# Patient Record
Sex: Female | Born: 1935
Health system: Southern US, Community
[De-identification: ages and names within clinical notes are randomized; demographics above are authoritative.]

## PROBLEM LIST (undated history)

## (undated) DIAGNOSIS — H269 Unspecified cataract: Secondary | ICD-10-CM

## (undated) DIAGNOSIS — Z8781 Personal history of (healed) traumatic fracture: Secondary | ICD-10-CM

## (undated) DIAGNOSIS — R05 Cough: Secondary | ICD-10-CM

## (undated) DIAGNOSIS — M1990 Unspecified osteoarthritis, unspecified site: Secondary | ICD-10-CM

## (undated) DIAGNOSIS — K635 Polyp of colon: Secondary | ICD-10-CM

## (undated) DIAGNOSIS — Z9109 Other allergy status, other than to drugs and biological substances: Secondary | ICD-10-CM

## (undated) DIAGNOSIS — N189 Chronic kidney disease, unspecified: Secondary | ICD-10-CM

## (undated) DIAGNOSIS — M810 Age-related osteoporosis without current pathological fracture: Secondary | ICD-10-CM

## (undated) DIAGNOSIS — K5790 Diverticulosis of intestine, part unspecified, without perforation or abscess without bleeding: Secondary | ICD-10-CM

## (undated) HISTORY — DX: Cough: R05

## (undated) HISTORY — DX: Polyp of colon: K63.5

## (undated) HISTORY — DX: Diverticulosis of intestine, part unspecified, without perforation or abscess without bleeding: K57.90

## (undated) HISTORY — DX: Unspecified osteoarthritis, unspecified site: M19.90

## (undated) HISTORY — DX: Other allergy status, other than to drugs and biological substances: Z91.09

## (undated) HISTORY — DX: Personal history of (healed) traumatic fracture: Z87.81

## (undated) HISTORY — PX: NO PAST SURGERIES: SHX2092

## (undated) HISTORY — DX: Unspecified cataract: H26.9

## (undated) HISTORY — DX: Age-related osteoporosis without current pathological fracture: M81.0

---

## 2000-05-22 ENCOUNTER — Ambulatory Visit (HOSPITAL_BASED_OUTPATIENT_CLINIC_OR_DEPARTMENT_OTHER): Admission: RE | Admit: 2000-05-22 | Discharge: 2000-05-22 | Payer: Self-pay | Admitting: General Surgery

## 2004-01-03 ENCOUNTER — Encounter: Payer: Self-pay | Admitting: Internal Medicine

## 2004-09-25 ENCOUNTER — Ambulatory Visit: Payer: Self-pay | Admitting: Internal Medicine

## 2005-06-15 ENCOUNTER — Ambulatory Visit: Payer: Self-pay | Admitting: Internal Medicine

## 2005-07-19 ENCOUNTER — Ambulatory Visit: Payer: Self-pay | Admitting: Internal Medicine

## 2005-09-25 ENCOUNTER — Encounter: Admission: RE | Admit: 2005-09-25 | Discharge: 2005-09-25 | Payer: Self-pay | Admitting: Orthopedic Surgery

## 2007-01-22 DIAGNOSIS — M810 Age-related osteoporosis without current pathological fracture: Secondary | ICD-10-CM | POA: Insufficient documentation

## 2007-01-22 DIAGNOSIS — M1990 Unspecified osteoarthritis, unspecified site: Secondary | ICD-10-CM | POA: Insufficient documentation

## 2007-07-03 ENCOUNTER — Encounter: Payer: Self-pay | Admitting: Internal Medicine

## 2007-08-15 ENCOUNTER — Ambulatory Visit: Payer: Self-pay | Admitting: Internal Medicine

## 2007-08-15 DIAGNOSIS — L723 Sebaceous cyst: Secondary | ICD-10-CM | POA: Insufficient documentation

## 2007-08-15 DIAGNOSIS — L821 Other seborrheic keratosis: Secondary | ICD-10-CM | POA: Insufficient documentation

## 2007-08-15 DIAGNOSIS — D485 Neoplasm of uncertain behavior of skin: Secondary | ICD-10-CM

## 2007-08-26 ENCOUNTER — Ambulatory Visit: Payer: Self-pay | Admitting: Internal Medicine

## 2007-08-26 DIAGNOSIS — E785 Hyperlipidemia, unspecified: Secondary | ICD-10-CM

## 2007-09-02 ENCOUNTER — Ambulatory Visit: Payer: Self-pay | Admitting: Internal Medicine

## 2008-09-14 ENCOUNTER — Encounter: Payer: Self-pay | Admitting: Internal Medicine

## 2008-12-16 ENCOUNTER — Encounter (INDEPENDENT_AMBULATORY_CARE_PROVIDER_SITE_OTHER): Payer: Self-pay | Admitting: *Deleted

## 2009-02-22 ENCOUNTER — Ambulatory Visit: Payer: Self-pay | Admitting: Internal Medicine

## 2009-02-22 DIAGNOSIS — R143 Flatulence: Secondary | ICD-10-CM

## 2009-02-22 DIAGNOSIS — R142 Eructation: Secondary | ICD-10-CM

## 2009-02-22 DIAGNOSIS — R141 Gas pain: Secondary | ICD-10-CM

## 2009-02-25 ENCOUNTER — Encounter: Admission: RE | Admit: 2009-02-25 | Discharge: 2009-02-25 | Payer: Self-pay | Admitting: Internal Medicine

## 2009-03-08 ENCOUNTER — Telehealth: Payer: Self-pay | Admitting: *Deleted

## 2009-03-29 ENCOUNTER — Telehealth: Payer: Self-pay | Admitting: *Deleted

## 2009-04-04 ENCOUNTER — Ambulatory Visit: Payer: Self-pay | Admitting: Internal Medicine

## 2009-04-04 DIAGNOSIS — Z860100 Personal history of colon polyps, unspecified: Secondary | ICD-10-CM | POA: Insufficient documentation

## 2009-04-04 DIAGNOSIS — R198 Other specified symptoms and signs involving the digestive system and abdomen: Secondary | ICD-10-CM

## 2009-05-10 ENCOUNTER — Ambulatory Visit: Payer: Self-pay | Admitting: Internal Medicine

## 2009-05-10 LAB — HM COLONOSCOPY

## 2009-05-12 ENCOUNTER — Encounter: Payer: Self-pay | Admitting: Internal Medicine

## 2009-06-13 ENCOUNTER — Other Ambulatory Visit: Admission: RE | Admit: 2009-06-13 | Discharge: 2009-06-13 | Payer: Self-pay | Admitting: Internal Medicine

## 2009-06-13 ENCOUNTER — Ambulatory Visit: Payer: Self-pay | Admitting: Internal Medicine

## 2009-06-13 DIAGNOSIS — F17200 Nicotine dependence, unspecified, uncomplicated: Secondary | ICD-10-CM | POA: Insufficient documentation

## 2009-09-15 ENCOUNTER — Encounter: Payer: Self-pay | Admitting: Internal Medicine

## 2010-07-02 LAB — CONVERTED CEMR LAB
Basophils Relative: 0.6 % (ref 0.0–1.0)
Bilirubin Urine: NEGATIVE
Bilirubin, Direct: 0.1 mg/dL (ref 0.0–0.3)
CO2: 29 meq/L (ref 19–32)
Calcium, Total (PTH): 9.5 mg/dL (ref 8.4–10.5)
Chloride: 106 meq/L (ref 96–112)
Creatinine, Ser: 0.8 mg/dL (ref 0.4–1.2)
Direct LDL: 135.6 mg/dL
Eosinophils Absolute: 0.3 10*3/uL (ref 0.0–0.6)
Eosinophils Absolute: 0.4 10*3/uL (ref 0.0–0.7)
Eosinophils Relative: 2.4 % (ref 0.0–5.0)
GFR calc non Af Amer: 74.97 mL/min (ref >60)
Glucose, Bld: 89 mg/dL (ref 70–99)
HCT: 42.5 % (ref 36.0–46.0)
HCT: 42.7 % (ref 36.0–46.0)
HDL: 59.6 mg/dL (ref >39.00)
Lymphocytes Relative: 20.5 % (ref 12.0–46.0)
Lymphs Abs: 2.6 10*3/uL (ref 0.7–4.0)
MCHC: 33.2 g/dL (ref 30.0–36.0)
Monocytes Absolute: 0.8 10*3/uL (ref 0.1–1.0)
Monocytes Relative: 6.4 % (ref 3.0–11.0)
Neutro Abs: 7.9 10*3/uL — ABNORMAL HIGH (ref 1.4–7.7)
Neutro Abs: 8.8 10*3/uL — ABNORMAL HIGH (ref 1.4–7.7)
Neutrophils Relative %: 69.8 % (ref 43.0–77.0)
Pap Smear: NORMAL
Platelets: 196 10*3/uL (ref 150–400)
Potassium: 4.6 meq/L (ref 3.5–5.1)
Potassium: 4.7 meq/L (ref 3.5–5.1)
Protein, U semiquant: NEGATIVE
RBC: 4.46 M/uL (ref 3.87–5.11)
Sodium: 141 meq/L (ref 135–145)
Sodium: 142 meq/L (ref 135–145)
Specific Gravity, Urine: 1.02
TSH: 1.14 microintl units/mL (ref 0.35–5.50)
Total CHOL/HDL Ratio: 4
Total Protein: 6.2 g/dL (ref 6.0–8.3)
Triglycerides: 146 mg/dL (ref 0.0–149.0)
Triglycerides: 82 mg/dL (ref 0–149)
VLDL: 16 mg/dL (ref 0–40)
WBC: 10.9 10*3/uL — ABNORMAL HIGH (ref 4.5–10.5)
pH: 5.5

## 2010-07-04 NOTE — Assessment & Plan Note (Signed)
Summary: emp/pt fasting/cjr   Vital Signs:  Patient profile:   75 year old female Menstrual status:  postmenopausal Height:      59.25 inches Weight:      123 pounds BMI:     24.72 Pulse rate:   60 / minute BP sitting:   100 / 70  (right arm) Cuff size:   regular  Vitals Entered By: Romualdo Bolk, CMA (AAMA) (June 13, 2009 8:59 AM) CC: Annunal visit for disease management- Discuss doing a pap   History of Present Illness: Nakiesha Rumsey comesin today for    medical   management. and check up. OA: about the same Osteoporosis  lst dexa? when  doesnt plan on taking rx meds No fam hx of hip fracture. LIPiDs: no meds  exercises HCM :Mammo UTD  Colon: utd  colon polyps  PAP NEver had abnormal ? klast one. Bowel urgency about the same . Had gi eval and pelvc US done normal in the fall 2010. No progression of signs   Preventive Care Screening  Mammogram:    Date:  09/14/2008    Results:  normal   Last Tetanus Booster:    Date:  09/25/2004    Results:  Td   Pap Smear:    Date:  06/04/2002    Results:  normal   Prior Values:    Colonoscopy:  DONE (05/10/2009)    Last Flu Shot:  Fluvax 3+ (03/07/2009)    Last Pneumovax:  Pneumovax (03/07/2009)   Preventive Screening-Counseling & Management  Alcohol-Tobacco     Alcohol drinks/day: 0     Smoking Status: current     Smoking Cessation Counseling: YES     Packs/Day: 0.25  Caffeine-Diet-Exercise     Caffeine use/day: 3-4     Does Patient Exercise: yes  Hep-HIV-STD-Contraception     Dental Visit-last 6 months yes     Sun Exposure-Excessive: no     Sun Exposure Counseling: in summer   Safety-Violence-Falls     Seat Belt Use: yes     Firearms in the Home: no firearms in the home     Smoke Detectors: yes      Blood Transfusions:  no.    Current Medications (verified): 1)  Glucosamine-Chondroitin 1500-1200 Mg/106ml  Liqd (Glucosamine-Chondroitin) 2)  Calcium 500/d 500-200 Mg-Unit  Tabs (Calcium  Carbonate-Vitamin D) 3)  Multivitamins   Caps (Multiple Vitamin) .... One By Mouth Daily 4)  Fish Oil 1000 Mg Caps (Omega-3 Fatty Acids) .Marland Kitchen.. 1 Capsule By Mouth Once Daily 5)  Aspirin 81 Mg Tbec (Aspirin) .Marland Kitchen.. 1 Tablet By Mouth Once Daily  Allergies (verified): No Known Drug Allergies  Past History:  Past medical, surgical, family and social histories (including risk factors) reviewed, and no changes noted (except as noted below).  Past Medical History: Osteoarthritis Osteoporosis  dexa 07  no fracture declined med -3.2 sp -1.6 hip. Blood in Stool Allergies G2P2 hx l arm fx  Diverticulosis Hyperplastic Polyps  Past Surgical History: Reviewed history from 01/22/2007 and no changes required. Colonoscopy CB x2  Family History: Reviewed history from 04/04/2009 and no changes required. Family History Breast cancer aunt Family History of Prostate CA 1st degree relativ father Family History of Stroke M 1st degree relative  fathrdied 72 mo died  mva  age 51 sis died 1s mva  bro lung problems No FH of Colon Cancer:  Social History: Reviewed history from 02/22/2009 and no changes required. Divorced/ widowed Occupation:sales  and Dietitian Current Smoker Alcohol use-no  Drug use-no Regular exercise-yes hh of 1 no pets  Blood Transfusions:  no  Review of Systems  The patient denies anorexia, fever, weight loss, weight gain, vision loss, decreased hearing, hoarseness, chest pain, syncope, dyspnea on exertion, peripheral edema, prolonged cough, headaches, hemoptysis, abdominal pain, melena, hematochezia, severe indigestion/heartburn, hematuria, incontinence, muscle weakness, suspicious skin lesions, transient blindness, difficulty walking, depression, unusual weight change, abnormal bleeding, enlarged lymph nodes, angioedema, and breast masses.   Physical Exam General Appearance: well developed, well nourished, no acute distress Eyes: conjunctiva and lids normal, PERRLA,  EOMI, WNL Ears, Nose, Mouth, Throat: TM clear, nares clear, oral exam WNL Neck: supple, no lymphadenopathy, no thyromegaly, no JVD Respiratory: clear to auscultation and percussion, respiratory effort normal Cardiovascular: regular rate and rhythm, S1-S2, no murmur, rub or gallop, no bruits, peripheral pulses normal and symmetric, no cyanosis, clubbing, edema .   few varicosities le no ulcers varicosities Chest: no scars, masses, tenderness; no asymmetry, skin changes, nipple discharge   Gastrointestinal: soft, non-tender; no hepatosplenomegaly, masses; active bowel sounds all quadrants, declined done by GI  Genitourinary: no vaginal discharge, lesions; no masses or tenderness cx os clear PAP done Lymphatic: no cervical, axillary or inguinal adenopathy Musculoskeletal: gait normal, muscle tone and strength WNL, no joint swelling, effusions, discoloration, crepitus  Skin: clear, good turgor, color WNL, no rashes, lesions, or ulcerations  between little and next toe there is a white calously dorn like lesion no redness or rahs. Neurologic: normal mental status, normal reflexes, normal strength, sensation, and motion Psychiatric: alert; oriented to person, place and time Other Exam: EKG NSR  no acute changes    Impression & Recommendations:  Problem # 1:  HEALTH MAINTENANCE EXAM, ADULT (ICD-V70.0) Discussed nutrition,exercise,diet,healthy weight, vitamin D and calcium.      Orders:  lab today  although not fasting   TLB-BMP (Basic Metabolic Panel-BMET) (80048-METABOL) TLB-CBC Platelet - w/Differential (85025-CBCD) UA Dipstick w/o Micro (automated)  (81003) EKG w/ Interpretation (93000) Venipuncture (10626)  Problem # 2:  ROUTINE GYNECOLOGICAL EXAMINATION (ICD-V72.31)  pap done   Orders: Obtaining Screening PAP Smear (R4854)  Problem # 3:  TOBACCO USER (ICD-305.1) rec dc   Problem # 4:  COLONIC POLYPS, HX OF (ICD-V12.72)  Orders: TLB-CBC Platelet - w/Differential  (85025-CBCD)  Problem # 5:  CHANGE IN BOWELS (ICD-787.99) neg work up.   Problem # 6:  OSTEOPOROSIS (ICD-733.00) doesnt want to take med .  still will recheck dexa to give adequate advice on risk   and choices  Her updated medication list for this problem includes:    Calcium 500/d 500-200 Mg-unit Tabs (Calcium carbonate-vitamin d)  Problem # 7:  OSTEOARTHRITIS (ICD-715.90) Assessment: Unchanged  Her updated medication list for this problem includes:    Aspirin 81 Mg Tbec (Aspirin) .Marland Kitchen... 1 tablet by mouth once daily probable corn  pressure change  use corn pads and if persistent and progressive  consider see derm.  Complete Medication List: 1)  Glucosamine-chondroitin 1500-1200 Mg/47ml Liqd (Glucosamine-chondroitin) 2)  Calcium 500/d 500-200 Mg-unit Tabs (Calcium carbonate-vitamin d) 3)  Multivitamins Caps (Multiple vitamin) .... One by mouth daily 4)  Fish Oil 1000 Mg Caps (Omega-3 fatty acids) .Marland Kitchen.. 1 capsule by mouth once daily 5)  Aspirin 81 Mg Tbec (Aspirin) .Marland Kitchen.. 1 tablet by mouth once daily  Other Orders: TLB-Hepatic/Liver Function Pnl (80076-HEPATIC) TLB-TSH (Thyroid Stimulating Hormone) (84443-TSH) TLB-Lipid Panel (80061-LIPID)  Patient Instructions: 1)  Tobacco is very bad for your health and your loved ones ! You should stop smoking !  2)  You will be informed of lab results when available.  3)  vit d 1000 international units per day 4)  calcium 1200 mg per day 5)  consider meds .  6)  Bone density  7)  resistance training  Laboratory Results   Urine Tests    Routine Urinalysis   Color: yellow Appearance: Clear Glucose: negative   (Normal Range: Negative) Bilirubin: negative   (Normal Range: Negative) Ketone: negative   (Normal Range: Negative) Spec. Gravity: 1.020   (Normal Range: 1.003-1.035) Blood: negative   (Normal Range: Negative) pH: 5.5   (Normal Range: 5.0-8.0) Protein: negative   (Normal Range: Negative) Urobilinogen: 0.2   (Normal Range:  0-1) Nitrite: negative   (Normal Range: Negative) Leukocyte Esterace: negative   (Normal Range: Negative)    Comments: Rita Ohara  June 13, 2009 11:35 AM

## 2011-11-27 ENCOUNTER — Encounter: Payer: Self-pay | Admitting: Internal Medicine

## 2012-07-22 ENCOUNTER — Ambulatory Visit (INDEPENDENT_AMBULATORY_CARE_PROVIDER_SITE_OTHER): Payer: Medicare Other | Admitting: Internal Medicine

## 2012-07-22 ENCOUNTER — Encounter: Payer: Self-pay | Admitting: Internal Medicine

## 2012-07-22 ENCOUNTER — Telehealth: Payer: Self-pay | Admitting: Internal Medicine

## 2012-07-22 VITALS — BP 104/64 | HR 98 | Temp 98.1°F | Wt 128.0 lb

## 2012-07-22 DIAGNOSIS — M810 Age-related osteoporosis without current pathological fracture: Secondary | ICD-10-CM

## 2012-07-22 DIAGNOSIS — J069 Acute upper respiratory infection, unspecified: Secondary | ICD-10-CM

## 2012-07-22 DIAGNOSIS — H9209 Otalgia, unspecified ear: Secondary | ICD-10-CM

## 2012-07-22 DIAGNOSIS — F17200 Nicotine dependence, unspecified, uncomplicated: Secondary | ICD-10-CM

## 2012-07-22 NOTE — Patient Instructions (Addendum)
Saline  Nose spray   is a good idea .   This illness will take another week to feel better and cough could last for another few weeks. However if relapsing after 2 weeks or localized pain   Then contact us for revaluation.    Your oxygen level is good today . Please  Stop tobacco it is dangerous for your lung health.   Upper Respiratory Infection, Adult An upper respiratory infection (URI) is also sometimes known as the common cold. The upper respiratory tract includes the nose, sinuses, throat, trachea, and bronchi. Bronchi are the airways leading to the lungs. Most people improve within 1 week, but symptoms can last up to 2 weeks. A residual cough may last even longer.  CAUSES Many different viruses can infect the tissues lining the upper respiratory tract. The tissues become irritated and inflamed and often become very moist. Mucus production is also common. A cold is contagious. You can easily spread the virus to others by oral contact. This includes kissing, sharing a glass, coughing, or sneezing. Touching your mouth or nose and then touching a surface, which is then touched by another person, can also spread the virus. SYMPTOMS  Symptoms typically develop 1 to 3 days after you come in contact with a cold virus. Symptoms vary from person to person. They may include:  Runny nose.  Sneezing.  Nasal congestion.  Sinus irritation.  Sore throat.  Loss of voice (laryngitis).  Cough.  Fatigue.  Muscle aches.  Loss of appetite.  Headache.  Low-grade fever. DIAGNOSIS  You might diagnose your own cold based on familiar symptoms, since most people get a cold 2 to 3 times a year. Your caregiver can confirm this based on your exam. Most importantly, your caregiver can check that your symptoms are not due to another disease such as strep throat, sinusitis, pneumonia, asthma, or epiglottitis. Blood tests, throat tests, and X-rays are not necessary to diagnose a common cold, but they may  sometimes be helpful in excluding other more serious diseases. Your caregiver will decide if any further tests are required. RISKS AND COMPLICATIONS  You may be at risk for a more severe case of the common cold if you smoke cigarettes, have chronic heart disease (such as heart failure) or lung disease (such as asthma), or if you have a weakened immune system. The very young and very old are also at risk for more serious infections. Bacterial sinusitis, middle ear infections, and bacterial pneumonia can complicate the common cold. The common cold can worsen asthma and chronic obstructive pulmonary disease (COPD). Sometimes, these complications can require emergency medical care and may be life-threatening. PREVENTION  The best way to protect against getting a cold is to practice good hygiene. Avoid oral or hand contact with people with cold symptoms. Wash your hands often if contact occurs. There is no clear evidence that vitamin C, vitamin E, echinacea, or exercise reduces the chance of developing a cold. However, it is always recommended to get plenty of rest and practice good nutrition. TREATMENT  Treatment is directed at relieving symptoms. There is no cure. Antibiotics are not effective, because the infection is caused by a virus, not by bacteria. Treatment may include:  Increased fluid intake. Sports drinks offer valuable electrolytes, sugars, and fluids.  Breathing heated mist or steam (vaporizer or shower).  Eating chicken soup or other clear broths, and maintaining good nutrition.  Getting plenty of rest.  Using gargles or lozenges for comfort.  Controlling fevers with  ibuprofen or acetaminophen as directed by your caregiver.  Increasing usage of your inhaler if you have asthma. Zinc gel and zinc lozenges, taken in the first 24 hours of the common cold, can shorten the duration and lessen the severity of symptoms. Pain medicines may help with fever, muscle aches, and throat pain. A  variety of non-prescription medicines are available to treat congestion and runny nose. Your caregiver can make recommendations and may suggest nasal or lung inhalers for other symptoms.  HOME CARE INSTRUCTIONS   Only take over-the-counter or prescription medicines for pain, discomfort, or fever as directed by your caregiver.  Use a warm mist humidifier or inhale steam from a shower to increase air moisture. This may keep secretions moist and make it easier to breathe.  Drink enough water and fluids to keep your urine clear or pale yellow.  Rest as needed.  Return to work when your temperature has returned to normal or as your caregiver advises. You may need to stay home longer to avoid infecting others. You can also use a face mask and careful hand washing to prevent spread of the virus. SEEK MEDICAL CARE IF:   After the first few days, you feel you are getting worse rather than better.  You need your caregiver's advice about medicines to control symptoms.  You develop chills, worsening shortness of breath, or brown or red sputum. These may be signs of pneumonia.  You develop yellow or brown nasal discharge or pain in the face, especially when you bend forward. These may be signs of sinusitis.  You develop a fever, swollen neck glands, pain with swallowing, or white areas in the back of your throat. These may be signs of strep throat. SEEK IMMEDIATE MEDICAL CARE IF:   You have a fever.  You develop severe or persistent headache, ear pain, sinus pain, or chest pain.  You develop wheezing, a prolonged cough, cough up blood, or have a change in your usual mucus (if you have chronic lung disease).  You develop sore muscles or a stiff neck. Document Released: 11/14/2000 Document Revised: 08/13/2011 Document Reviewed: 09/22/2010 [facility] Patient Information 2013 [place], [place].

## 2012-07-22 NOTE — Progress Notes (Signed)
Chief Complaint  Patient presents with  . Nasal Congestion    Started last Wednesday.    . Sore Throat  . Cough  . Otalgia    HPI: Patient comes in today for SDA for  new problem evaluation. Last OV  was over 3 years ago  . No current data in current EHR.  But reviewed in the [facility] system. Onset with tickle and throat   And cough no fever   . About 6 days ago.  Played tennis 2 days  ago . And seemed ok.  Then  Cough worse. And sounded worse people told her she should get checked although she denies chest pain shortness of breath she does have some drainage but no hemoptysis fever or chills. She is helping a friend who has a very sick husband and doesn't want to make him ill either.  She does continue to smoke about 8-10 cigarettes at night. Denies specific diagnosis of COPD but uncertain if she ever had pulmonary function tests or evaluation in that area. She has no ongoing chronic medications. She does take vitamins calcium vitamin D for osteoporosis, and she has declined prescription medication for this problem. She does have some osteoarthritis unchanged. ROS: See pertinent positives and negatives per HPI. As per history of present illness negative major changes in vision hearing chest pain shortness of breath syncope unusual bleeding UTI symptoms falling neurologic signs.  Past Medical History  Diagnosis Date  . History of arm fracture     Left  . Diverticulosis   . Colon polyp, hyperplastic   . Osteoarthritis   . Osteoporosis     DEXA 2007-3.2 spine -1.6 hip  . Environmental allergies     Family History  Problem Relation Age of Onset  . Breast cancer    . Lung disease    . Stroke Father 53  . Prostate cancer Father   . Other Mother     Died MVA sister died MVA age 87    History   Social History  . Marital Status: Divorced    Spouse Name: N/A    Number of Children: N/A  . Years of Education: N/A   Social History Main Topics  . Smoking status: Current Some Day  Smoker  . Smokeless tobacco: None  . Alcohol Use: None  . Drug Use: None  . Sexually Active: None   Other Topics Concern  . None   Social History Narrative   Childbirth x2 g2 p2    Negative alcohol tobacco times many years 8 to 10 in the evening   Does do regular exercise seat belt use.   Divorced widowed Careers information officer   No pets    No outpatient encounter prescriptions on file as of 07/22/2012.   No facility-administered encounter medications on file as of 07/22/2012.    EXAM:  BP 104/64  Pulse 98  Temp(Src) 98.1 F (36.7 C) (Oral)  Wt 128 lb (58.06 kg)  BMI 25.63 kg/m2  SpO2 96%  Body mass index is 25.63 kg/(m^2).  GENERAL: vitals reviewed and listed above, alert, oriented, appears well hydrated and in no acute distress WDWN in NAD  quiet respirations; mildly congested  somewhat hoarse. Non toxic . HEENT: Normocephalic ;atraumatic , Eyes;  PERRL, EOMs  Full, lids and conjunctiva clear,,Ears: no deformities, canals nl, TM landmarks normal, Nose: no deformity or discharge but congested;face non tender Mouth : OP clear without lesion or edema . Neck: Supple without adenopathy or masses or bruits Chest:  Clear to A&P without wheezes rales or rhonchi increased AP diameter possibly decreased breath sounds but no acute changes quiet respirations. CV:  S1-S2 no gallops or murmurs peripheral perfusion is normal Skin :nl perfusion and no acute rashes  MS: moves all extremities  normal gait and balance  PSYCH: pleasant and cooperative, no obvious depression or anxiety  ASSESSMENT AND PLAN:  Discussed the following assessment and plan:  Viral upper respiratory tract infection with cough - No current evidence of complication but he is high risk discussed alarm features and reason for return at this point I do not think antibiotics will change the   TOBACCO USER - stoppping tobacco would help her lungs counseled today   OSTEOPOROSIS  Otalgia - Probably referred  eustachian tube dysfunction Disc communicability to others  at high risk can use mask /caution. -Patient advised to return or notify health care team  if symptoms worsen or persist or new concerns arise. Reviewed ol record and data input hx taken   Patient Instructions  Saline  Nose spray   is a good idea .   This illness will take another week to feel better and cough could last for another few weeks. However if relapsing after 2 weeks or localized pain   Then contact us for revaluation.    Your oxygen level is good today . Please  Stop tobacco it is dangerous for your lung health.   Upper Respiratory Infection, Adult An upper respiratory infection (URI) is also sometimes known as the common cold. The upper respiratory tract includes the nose, sinuses, throat, trachea, and bronchi. Bronchi are the airways leading to the lungs. Most people improve within 1 week, but symptoms can last up to 2 weeks. A residual cough may last even longer.  CAUSES Many different viruses can infect the tissues lining the upper respiratory tract. The tissues become irritated and inflamed and often become very moist. Mucus production is also common. A cold is contagious. You can easily spread the virus to others by oral contact. This includes kissing, sharing a glass, coughing, or sneezing. Touching your mouth or nose and then touching a surface, which is then touched by another person, can also spread the virus. SYMPTOMS  Symptoms typically develop 1 to 3 days after you come in contact with a cold virus. Symptoms vary from person to person. They may include:  Runny nose.  Sneezing.  Nasal congestion.  Sinus irritation.  Sore throat.  Loss of voice (laryngitis).  Cough.  Fatigue.  Muscle aches.  Loss of appetite.  Headache.  Low-grade fever. DIAGNOSIS  You might diagnose your own cold based on familiar symptoms, since most people get a cold 2 to 3 times a year. Your caregiver can confirm this  based on your exam. Most importantly, your caregiver can check that your symptoms are not due to another disease such as strep throat, sinusitis, pneumonia, asthma, or epiglottitis. Blood tests, throat tests, and X-rays are not necessary to diagnose a common cold, but they may sometimes be helpful in excluding other more serious diseases. Your caregiver will decide if any further tests are required. RISKS AND COMPLICATIONS  You may be at risk for a more severe case of the common cold if you smoke cigarettes, have chronic heart disease (such as heart failure) or lung disease (such as asthma), or if you have a weakened immune system. The very young and very old are also at risk for more serious infections. Bacterial sinusitis, middle ear infections, and bacterial  pneumonia can complicate the common cold. The common cold can worsen asthma and chronic obstructive pulmonary disease (COPD). Sometimes, these complications can require emergency medical care and may be life-threatening. PREVENTION  The best way to protect against getting a cold is to practice good hygiene. Avoid oral or hand contact with people with cold symptoms. Wash your hands often if contact occurs. There is no clear evidence that vitamin C, vitamin E, echinacea, or exercise reduces the chance of developing a cold. However, it is always recommended to get plenty of rest and practice good nutrition. TREATMENT  Treatment is directed at relieving symptoms. There is no cure. Antibiotics are not effective, because the infection is caused by a virus, not by bacteria. Treatment may include:  Increased fluid intake. Sports drinks offer valuable electrolytes, sugars, and fluids.  Breathing heated mist or steam (vaporizer or shower).  Eating chicken soup or other clear broths, and maintaining good nutrition.  Getting plenty of rest.  Using gargles or lozenges for comfort.  Controlling fevers with ibuprofen or acetaminophen as directed by your  caregiver.  Increasing usage of your inhaler if you have asthma. Zinc gel and zinc lozenges, taken in the first 24 hours of the common cold, can shorten the duration and lessen the severity of symptoms. Pain medicines may help with fever, muscle aches, and throat pain. A variety of non-prescription medicines are available to treat congestion and runny nose. Your caregiver can make recommendations and may suggest nasal or lung inhalers for other symptoms.  HOME CARE INSTRUCTIONS   Only take over-the-counter or prescription medicines for pain, discomfort, or fever as directed by your caregiver.  Use a warm mist humidifier or inhale steam from a shower to increase air moisture. This may keep secretions moist and make it easier to breathe.  Drink enough water and fluids to keep your urine clear or pale yellow.  Rest as needed.  Return to work when your temperature has returned to normal or as your caregiver advises. You may need to stay home longer to avoid infecting others. You can also use a face mask and careful hand washing to prevent spread of the virus. SEEK MEDICAL CARE IF:   After the first few days, you feel you are getting worse rather than better.  You need your caregiver's advice about medicines to control symptoms.  You develop chills, worsening shortness of breath, or brown or red sputum. These may be signs of pneumonia.  You develop yellow or brown nasal discharge or pain in the face, especially when you bend forward. These may be signs of sinusitis.  You develop a fever, swollen neck glands, pain with swallowing, or white areas in the back of your throat. These may be signs of strep throat. SEEK IMMEDIATE MEDICAL CARE IF:   You have a fever.  You develop severe or persistent headache, ear pain, sinus pain, or chest pain.  You develop wheezing, a prolonged cough, cough up blood, or have a change in your usual mucus (if you have chronic lung disease).  You develop sore  muscles or a stiff neck. Document Released: 11/14/2000 Document Revised: 08/13/2011 Document Reviewed: 09/22/2010 [facility] Patient Information 2013 [place], [place].      Neta Mends. Khori Underberg M.D.

## 2012-07-22 NOTE — Telephone Encounter (Signed)
Patient Information:  Caller Name: Darel Hong  Phone: [phone]  Patient: Megan Lucas  Gender: Female  DOB: 1936/05/19  Age: 77 Years  PCP: Berniece Andreas [facility] [place])  Office Follow Up:  Does the office need to follow up with this patient?: No  Instructions For The Office: N/A  RN Note:  Fever began 07/19/12.  States her ears feel full especially when she swallows.  States her nose is not running, but her "head feels full."  Per sore throat protocol, emergent symptoms denied; appt scheduled 07/22/12 1330 with Dr. Fabian Sharp.  krs/can  Symptoms  Reason For Call & Symptoms: sore throat, cough, with onset of fever to 101 07/19/12  Reviewed Health History In EMR: Yes  Reviewed Medications In EMR: Yes  Reviewed Allergies In EMR: Yes  Reviewed Surgeries / Procedures: Yes  Date of Onset of Symptoms: 07/15/2012  Guideline(s) Used:  Sore Throat  Disposition Per Guideline:   See Today in Office  Reason For Disposition Reached:   Earache also present  Advice Given:  N/A  Appointment Scheduled:  07/22/2012 13:30:00 Appointment Scheduled Provider:  Berniece Andreas (Family Practice)

## 2012-07-25 ENCOUNTER — Encounter: Payer: Self-pay | Admitting: Internal Medicine

## 2012-07-25 DIAGNOSIS — J069 Acute upper respiratory infection, unspecified: Secondary | ICD-10-CM | POA: Insufficient documentation

## 2012-09-05 ENCOUNTER — Ambulatory Visit (INDEPENDENT_AMBULATORY_CARE_PROVIDER_SITE_OTHER): Payer: Medicare Other | Admitting: Internal Medicine

## 2012-09-05 ENCOUNTER — Ambulatory Visit (INDEPENDENT_AMBULATORY_CARE_PROVIDER_SITE_OTHER)
Admission: RE | Admit: 2012-09-05 | Discharge: 2012-09-05 | Disposition: A | Discharge status: Home / Self Care | Payer: Medicare Other | Source: Ambulatory Visit | Attending: Internal Medicine | Admitting: Internal Medicine

## 2012-09-05 ENCOUNTER — Encounter: Payer: Self-pay | Admitting: Internal Medicine

## 2012-09-05 VITALS — BP 100/64 | HR 84 | Temp 98.9°F | Ht 59.0 in | Wt 126.0 lb

## 2012-09-05 DIAGNOSIS — M810 Age-related osteoporosis without current pathological fracture: Secondary | ICD-10-CM

## 2012-09-05 DIAGNOSIS — M1990 Unspecified osteoarthritis, unspecified site: Secondary | ICD-10-CM

## 2012-09-05 DIAGNOSIS — H9193 Unspecified hearing loss, bilateral: Secondary | ICD-10-CM

## 2012-09-05 DIAGNOSIS — H9190 Unspecified hearing loss, unspecified ear: Secondary | ICD-10-CM | POA: Insufficient documentation

## 2012-09-05 DIAGNOSIS — R05 Cough: Secondary | ICD-10-CM

## 2012-09-05 DIAGNOSIS — Z860100 Personal history of colon polyps, unspecified: Secondary | ICD-10-CM

## 2012-09-05 DIAGNOSIS — E785 Hyperlipidemia, unspecified: Secondary | ICD-10-CM

## 2012-09-05 DIAGNOSIS — F17200 Nicotine dependence, unspecified, uncomplicated: Secondary | ICD-10-CM

## 2012-09-05 DIAGNOSIS — Z0000 Encounter for general adult medical examination without abnormal findings: Secondary | ICD-10-CM

## 2012-09-05 DIAGNOSIS — R053 Chronic cough: Secondary | ICD-10-CM

## 2012-09-05 HISTORY — DX: Chronic cough: R05.3

## 2012-09-05 LAB — BASIC METABOLIC PANEL
CO2: 29 mEq/L (ref 19–32)
Calcium: 9.9 mg/dL (ref 8.4–10.5)
GFR: 82.58 mL/min (ref >60.00)
Potassium: 4.7 mEq/L (ref 3.5–5.1)

## 2012-09-05 LAB — HEPATIC FUNCTION PANEL
ALT: 16 U/L (ref 0–35)
AST: 16 U/L (ref 0–37)

## 2012-09-05 LAB — CBC WITH DIFFERENTIAL/PLATELET
Basophils Relative: 0.6 % (ref 0.0–3.0)
Eosinophils Absolute: 0.2 10*3/uL (ref 0.0–0.7)
Hemoglobin: 13.7 g/dL (ref 12.0–15.0)
MCHC: 33.8 g/dL (ref 30.0–36.0)
Monocytes Absolute: 0.5 10*3/uL (ref 0.1–1.0)
Neutro Abs: 4.8 10*3/uL (ref 1.4–7.7)
Neutrophils Relative %: 63.5 % (ref 43.0–77.0)
RBC: 4.41 Mil/uL (ref 3.87–5.11)
RDW: 13.5 % (ref 11.5–14.6)
WBC: 7.6 10*3/uL (ref 4.5–10.5)

## 2012-09-05 LAB — LIPID PANEL: Triglycerides: 115 mg/dL (ref 0.0–149.0)

## 2012-09-05 NOTE — Patient Instructions (Addendum)
Stopping tobacco is the best thing you can do for your health . Because of the ongoing cough please gets a chest x-ray that we discussed we'll let she know those results.  It is possible you have lung damage from the years of smoking. We could consider getting a lung function tests to see the status of your lungs but if you're having no symptoms I would just tell you to stop smoking anyway.  You're probably due for a bone density scan because of the history of osteoporosis. Check your vitamin D level to see if this is adequate and we should order a bone density.  Please get an eye examination  contact her insurance carrier about who is in your network.  Check with your insurance about cost and coverage of shingles vaccine and you can come back and get it anytime   Preventive Care for Adults, Female A healthy lifestyle and preventive care can promote health and wellness. Preventive health guidelines for women include the following key practices.  A routine yearly physical is a good way to check with your caregiver about your health and preventive screening. It is a chance to share any concerns and updates on your health, and to receive a thorough exam.  Visit your dentist for a routine exam and preventive care every 6 months. Brush your teeth twice a day and floss once a day. Good oral hygiene prevents tooth decay and gum disease.  The frequency of eye exams is based on your age, health, family medical history, use of contact lenses, and other factors. Follow your caregiver's recommendations for frequency of eye exams.  Eat a healthy diet. Foods like vegetables, fruits, whole grains, low-fat dairy products, and lean protein foods contain the nutrients you need without too many calories. Decrease your intake of foods high in solid fats, added sugars, and salt. Eat the right amount of calories for you.Get information about a proper diet from your caregiver, if necessary.  Regular physical  exercise is one of the most important things you can do for your health. Most adults should get at least 150 minutes of moderate-intensity exercise (any activity that increases your heart rate and causes you to sweat) each week. In addition, most adults need muscle-strengthening exercises on 2 or more days a week.  Maintain a healthy weight. The body mass index (BMI) is a screening tool to identify possible weight problems. It provides an estimate of body fat based on height and weight. Your caregiver can help determine your BMI, and can help you achieve or maintain a healthy weight.For adults 20 years and older:  A BMI below 18.5 is considered underweight.  A BMI of 18.5 to 24.9 is normal.  A BMI of 25 to 29.9 is considered overweight.  A BMI of 30 and above is considered obese.  Maintain normal blood lipids and cholesterol levels by exercising and minimizing your intake of saturated fat. Eat a balanced diet with plenty of fruit and vegetables. Blood tests for lipids and cholesterol should begin at age 54 and be repeated every 5 years. If your lipid or cholesterol levels are high, you are over 50, or you are at high risk for heart disease, you may need your cholesterol levels checked more frequently.Ongoing high lipid and cholesterol levels should be treated with medicines if diet and exercise are not effective.  If you smoke, find out from your caregiver how to quit. If you do not use tobacco, do not start.  If you are pregnant,  do not drink alcohol. If you are breastfeeding, be very cautious about drinking alcohol. If you are not pregnant and choose to drink alcohol, do not exceed 1 drink per day. One drink is considered to be 12 ounces (355 mL) of beer, 5 ounces (148 mL) of wine, or 1.5 ounces (44 mL) of liquor.  Avoid use of street drugs. Do not share needles with anyone. Ask for help if you need support or instructions about stopping the use of drugs.  High blood pressure causes heart  disease and increases the risk of stroke. Your blood pressure should be checked at least every 1 to 2 years. Ongoing high blood pressure should be treated with medicines if weight loss and exercise are not effective.  If you are 62 to 75 years old, ask your caregiver if you should take aspirin to prevent strokes.  Diabetes screening involves taking a blood sample to check your fasting blood sugar level. This should be done once every 3 years, after age 71, if you are within normal weight and without risk factors for diabetes. Testing should be considered at a younger age or be carried out more frequently if you are overweight and have at least 1 risk factor for diabetes.  Breast cancer screening is essential preventive care for women. You should practice "breast self-awareness." This means understanding the normal appearance and feel of your breasts and may include breast self-examination. Any changes detected, no matter how small, should be reported to a caregiver. Women in their 27s and 30s should have a clinical breast exam (CBE) by a caregiver as part of a regular health exam every 1 to 3 years. After age 52, women should have a CBE every year. Starting at age 26, women should consider having a mammography (breast X-ray test) every year. Women who have a family history of breast cancer should talk to their caregiver about genetic screening. Women at a high risk of breast cancer should talk to their caregivers about having magnetic resonance imaging (MRI) and a mammography every year.  The Pap test is a screening test for cervical cancer. A Pap test can show cell changes on the cervix that might become cervical cancer if left untreated. A Pap test is a procedure in which cells are obtained and examined from the lower end of the uterus (cervix).  Women should have a Pap test starting at age 31.  Between ages 44 and 49, Pap tests should be repeated every 2 years.  Beginning at age 75, you should have  a Pap test every 3 years as long as the past 3 Pap tests have been normal.  Some women have medical problems that increase the chance of getting cervical cancer. Talk to your caregiver about these problems. It is especially important to talk to your caregiver if a new problem develops soon after your last Pap test. In these cases, your caregiver may recommend more frequent screening and Pap tests.  The above recommendations are the same for women who have or have not gotten the vaccine for human papillomavirus (HPV).  If you had a hysterectomy for a problem that was not cancer or a condition that could lead to cancer, then you no longer need Pap tests. Even if you no longer need a Pap test, a regular exam is a good idea to make sure no other problems are starting.  If you are between ages 40 and 13, and you have had normal Pap tests going back 10 years, you no  longer need Pap tests. Even if you no longer need a Pap test, a regular exam is a good idea to make sure no other problems are starting.  If you have had past treatment for cervical cancer or a condition that could lead to cancer, you need Pap tests and screening for cancer for at least 20 years after your treatment.  If Pap tests have been discontinued, risk factors (such as a new sexual partner) need to be reassessed to determine if screening should be resumed.  The HPV test is an additional test that may be used for cervical cancer screening. The HPV test looks for the virus that can cause the cell changes on the cervix. The cells collected during the Pap test can be tested for HPV. The HPV test could be used to screen women aged 77 years and older, and should be used in women of any age who have unclear Pap test results. After the age of 46, women should have HPV testing at the same frequency as a Pap test.  Colorectal cancer can be detected and often prevented. Most routine colorectal cancer screening begins at the age of 53 and continues  through age 17. However, your caregiver may recommend screening at an earlier age if you have risk factors for colon cancer. On a yearly basis, your caregiver may provide home test kits to check for hidden blood in the stool. Use of a small camera at the end of a tube, to directly examine the colon (sigmoidoscopy or colonoscopy), can detect the earliest forms of colorectal cancer. Talk to your caregiver about this at age 49, when routine screening begins. Direct examination of the colon should be repeated every 5 to 10 years through age 68, unless early forms of pre-cancerous polyps or small growths are found.  Hepatitis C blood testing is recommended for all people born from [birth year] through 1965 and any individual with known risks for hepatitis C.  Practice safe sex. Use condoms and avoid high-risk sexual practices to reduce the spread of sexually transmitted infections (STIs). STIs include gonorrhea, chlamydia, syphilis, trichomonas, herpes, HPV, and human immunodeficiency virus (HIV). Herpes, HIV, and HPV are viral illnesses that have no cure. They can result in disability, cancer, and death. Sexually active women aged 37 and younger should be checked for chlamydia. Older women with new or multiple partners should also be tested for chlamydia. Testing for other STIs is recommended if you are sexually active and at increased risk.  Osteoporosis is a disease in which the bones lose minerals and strength with aging. This can result in serious bone fractures. The risk of osteoporosis can be identified using a bone density scan. Women ages 34 and over and women at risk for fractures or osteoporosis should discuss screening with their caregivers. Ask your caregiver whether you should take a calcium supplement or vitamin D to reduce the rate of osteoporosis.  Menopause can be associated with physical symptoms and risks. Hormone replacement therapy is available to decrease symptoms and risks. You should talk to  your caregiver about whether hormone replacement therapy is right for you.  Use sunscreen with sun protection factor (SPF) of 30 or more. Apply sunscreen liberally and repeatedly throughout the day. You should seek shade when your shadow is shorter than you. Protect yourself by wearing long sleeves, pants, a wide-brimmed hat, and sunglasses year round, whenever you are outdoors.  Once a month, do a whole body skin exam, using a mirror to look at the skin  on your back. Notify your caregiver of new moles, moles that have irregular borders, moles that are larger than a pencil eraser, or moles that have changed in shape or color.  Stay current with required immunizations.  Influenza. You need a dose every fall (or winter). The composition of the flu vaccine changes each year, so being vaccinated once is not enough.  Pneumococcal polysaccharide. You need 1 to 2 doses if you smoke cigarettes or if you have certain chronic medical conditions. You need 1 dose at age 54 (or older) if you have never been vaccinated.  Tetanus, diphtheria, pertussis (Tdap, Td). Get 1 dose of Tdap vaccine if you are younger than age 45, are over 39 and have contact with an infant, are a Research scientist (physical sciences), are pregnant, or simply want to be protected from whooping cough. After that, you need a Td booster dose every 10 years. Consult your caregiver if you have not had at least 3 tetanus and diphtheria-containing shots sometime in your life or have a deep or dirty wound.  HPV. You need this vaccine if you are a woman age 31 or younger. The vaccine is given in 3 doses over 6 months.  Measles, mumps, rubella (MMR). You need at least 1 dose of MMR if you were born in 1957 or later. You may also need a second dose.  Meningococcal. If you are age 27 to 30 and a first-year college student living in a residence hall, or have one of several medical conditions, you need to get vaccinated against meningococcal disease. You may also need  additional booster doses.  Zoster (shingles). If you are age 59 or older, you should get this vaccine.  Varicella (chickenpox). If you have never had chickenpox or you were vaccinated but received only 1 dose, talk to your caregiver to find out if you need this vaccine.  Hepatitis A. You need this vaccine if you have a specific risk factor for hepatitis A virus infection or you simply wish to be protected from this disease. The vaccine is usually given as 2 doses, 6 to 18 months apart.  Hepatitis B. You need this vaccine if you have a specific risk factor for hepatitis B virus infection or you simply wish to be protected from this disease. The vaccine is given in 3 doses, usually over 6 months. Preventive Services / Frequency Ages 71 to 55  Blood pressure check.** / Every 1 to 2 years.  Lipid and cholesterol check.** / Every 5 years beginning at age 67.  Clinical breast exam.** / Every 3 years for women in their 26s and 30s.  Pap test.** / Every 2 years from ages 28 through 29. Every 3 years starting at age 28 through age 55 or 61 with a history of 3 consecutive normal Pap tests.  HPV screening.** / Every 3 years from ages 94 through ages 13 to 67 with a history of 3 consecutive normal Pap tests.  Hepatitis C blood test.** / For any individual with known risks for hepatitis C.  Skin self-exam. / Monthly.  Influenza immunization.** / Every year.  Pneumococcal polysaccharide immunization.** / 1 to 2 doses if you smoke cigarettes or if you have certain chronic medical conditions.  Tetanus, diphtheria, pertussis (Tdap, Td) immunization. / A one-time dose of Tdap vaccine. After that, you need a Td booster dose every 10 years.  HPV immunization. / 3 doses over 6 months, if you are 42 and younger.  Measles, mumps, rubella (MMR) immunization. / You need  at least 1 dose of MMR if you were born in 1957 or later. You may also need a second dose.  Meningococcal immunization. / 1 dose if you  are age 55 to 41 and a first-year college student living in a residence hall, or have one of several medical conditions, you need to get vaccinated against meningococcal disease. You may also need additional booster doses.  Varicella immunization.** / Consult your caregiver.  Hepatitis A immunization.** / Consult your caregiver. 2 doses, 6 to 18 months apart.  Hepatitis B immunization.** / Consult your caregiver. 3 doses usually over 6 months. Ages 9 to 24  Blood pressure check.** / Every 1 to 2 years.  Lipid and cholesterol check.** / Every 5 years beginning at age 59.  Clinical breast exam.** / Every year after age 69.  Mammogram.** / Every year beginning at age 33 and continuing for as long as you are in good health. Consult with your caregiver.  Pap test.** / Every 3 years starting at age 70 through age 21 or 68 with a history of 3 consecutive normal Pap tests.  HPV screening.** / Every 3 years from ages 6 through ages 33 to 35 with a history of 3 consecutive normal Pap tests.  Fecal occult blood test (FOBT) of stool. / Every year beginning at age 70 and continuing until age 23. You may not need to do this test if you get a colonoscopy every 10 years.  Flexible sigmoidoscopy or colonoscopy.** / Every 5 years for a flexible sigmoidoscopy or every 10 years for a colonoscopy beginning at age 40 and continuing until age 61.  Hepatitis C blood test.** / For all people born from [birth year] through 1965 and any individual with known risks for hepatitis C.  Skin self-exam. / Monthly.  Influenza immunization.** / Every year.  Pneumococcal polysaccharide immunization.** / 1 to 2 doses if you smoke cigarettes or if you have certain chronic medical conditions.  Tetanus, diphtheria, pertussis (Tdap, Td) immunization.** / A one-time dose of Tdap vaccine. After that, you need a Td booster dose every 10 years.  Measles, mumps, rubella (MMR) immunization. / You need at least 1 dose of MMR if you  were born in 1957 or later. You may also need a second dose.  Varicella immunization.** / Consult your caregiver.  Meningococcal immunization.** / Consult your caregiver.  Hepatitis A immunization.** / Consult your caregiver. 2 doses, 6 to 18 months apart.  Hepatitis B immunization.** / Consult your caregiver. 3 doses, usually over 6 months. Ages 27 and over  Blood pressure check.** / Every 1 to 2 years.  Lipid and cholesterol check.** / Every 5 years beginning at age 74.  Clinical breast exam.** / Every year after age 36.  Mammogram.** / Every year beginning at age 1 and continuing for as long as you are in good health. Consult with your caregiver.  Pap test.** / Every 3 years starting at age 37 through age 76 or 29 with a 3 consecutive normal Pap tests. Testing can be stopped between 65 and 70 with 3 consecutive normal Pap tests and no abnormal Pap or HPV tests in the past 10 years.  HPV screening.** / Every 3 years from ages 64 through ages 36 or 14 with a history of 3 consecutive normal Pap tests. Testing can be stopped between 65 and 70 with 3 consecutive normal Pap tests and no abnormal Pap or HPV tests in the past 10 years.  Fecal occult blood test (FOBT) of stool. /  Every year beginning at age 57 and continuing until age 16. You may not need to do this test if you get a colonoscopy every 10 years.  Flexible sigmoidoscopy or colonoscopy.** / Every 5 years for a flexible sigmoidoscopy or every 10 years for a colonoscopy beginning at age 3 and continuing until age 69.  Hepatitis C blood test.** / For all people born from [birth year] through 1965 and any individual with known risks for hepatitis C.  Osteoporosis screening.** / A one-time screening for women ages 77 and over and women at risk for fractures or osteoporosis.  Skin self-exam. / Monthly.  Influenza immunization.** / Every year.  Pneumococcal polysaccharide immunization.** / 1 dose at age 41 (or older) if you have never  been vaccinated.  Tetanus, diphtheria, pertussis (Tdap, Td) immunization. / A one-time dose of Tdap vaccine if you are over 65 and have contact with an infant, are a Research scientist (physical sciences), or simply want to be protected from whooping cough. After that, you need a Td booster dose every 10 years.  Varicella immunization.** / Consult your caregiver.  Meningococcal immunization.** / Consult your caregiver.  Hepatitis A immunization.** / Consult your caregiver. 2 doses, 6 to 18 months apart.  Hepatitis B immunization.** / Check with your caregiver. 3 doses, usually over 6 months. ** Family history and personal history of risk and conditions may change your caregiver's recommendations. Document Released: 07/17/2001 Document Revised: 08/13/2011 Document Reviewed: 10/16/2010 [facility] Patient Information 2013 [place], [place].

## 2012-09-05 NOTE — Progress Notes (Signed)
Chief Complaint  Patient presents with  . Medicare Wellness    HPI: Patient comes in today for Preventive Medicare wellness visit . No major injuries, ed visits ,hospitalizations , new medications since last visit. She continues to smoke but only a small amount she says no unusual falling stays physically active denies any significant change in reading capacity. However she has had a cough that has been ongoing since her last visit without associated fever or hemoptysis. She's not on any regular medicines except her vitamins calcium and vitamin D.     Hearing: Somewhat down at times in a noisy room.  Vision:  No limitations at present .  Hasn't had an eye check in every 10 years  Safety:  Has smoke detector and wears seat belts.  No firearms. No excess sun exposure.   Falls: No  Advance directive :  Reviewed  Has one.  Memory: Felt to be good  , no concern from her or her family.  Depression: No anhedonia unusual crying or depressive symptoms  Nutrition: Eats well balanced diet; adequate calcium and vitamin D. No swallowing chewing problems.  Injury: no major injuries in the last six months.  Other healthcare providers:  Reviewed today .  Social:  Lives alone No pets.   Preventive parameters: up-to-date  Reviewed   ADLS:   There are no problems or need for assistance  driving, feeding, obtaining food, dressing, toileting and bathing, managing money using phone. She is independent.  EXERCISE/ HABITS  Per week   about 6 cigarettes per day tobacco has been smoking since 1952   no etoh physically active dance hx and plays tennis still 3-4 x per week. Lower exercises for back daily in exercise 30-60 minutes   ROS:  GEN/ HEENT: No fever, significant weight changes sweats headaches vision problems hearing changes, CV/ PULM; No chest pain shortness of breath cough, syncope,edema  change in exercise tolerance. GI /GU: No adominal pain, vomiting, change in bowel habits. No blood  in the stool. No significant GU symptoms. Has stool frequency but has had this for a number of years has up-to-date on colonoscopy 2010 SKIN/HEME: ,no acute skin rashes suspicious lesions or bleeding. No lymphadenopathy, nodules, masses.  NEURO/ PSYCH:  No neurologic signs such as weakness numbness. No depression anxiety. IMM/ Allergy: No unusual infections.  Allergy .   REST of 12 system review negative except as per HPI   Past Medical History  Diagnosis Date  . History of arm fracture     Left  . Diverticulosis   . Colon polyp, hyperplastic   . Osteoarthritis   . Osteoporosis     DEXA 2007-3.2 spine -1.6 hip  . Environmental allergies     Family History  Problem Relation Age of Onset  . Breast cancer    . Lung disease    . Stroke Father 35  . Prostate cancer Father   . Other Mother     Died MVA sister died MVA age 32    History   Social History  . Marital Status: Divorced    Spouse Name: N/A    Number of Children: N/A  . Years of Education: N/A   Social History Main Topics  . Smoking status: Current Some Day Smoker  . Smokeless tobacco: None  . Alcohol Use: None  . Drug Use: None  . Sexually Active: None   Other Topics Concern  . None   Social History Narrative   Childbirth x2 g2 p2  Negative alcohol tobacco times many years 8 to 10 in the evening   Does do regular exercise seat belt use.   Divorced widowed Careers information officer   No pets    No outpatient encounter prescriptions on file as of 09/05/2012.   No facility-administered encounter medications on file as of 09/05/2012.    EXAM:  BP 100/64  Pulse 84  Temp(Src) 98.9 F (37.2 C) (Oral)  Ht 4\' 11"  (1.499 m)  Wt 126 lb (57.153 kg)  BMI 25.44 kg/m2  SpO2 96%  Body mass index is 25.44 kg/(m^2).  Physical Exam: Vital signs reviewed AOZ:HYQM is a well-developed well-nourished alert cooperative   who appears stated age in no acute distress.  HEENT: normocephalic atraumatic , Eyes:  PERRL EOM's full, conjunctiva clear, Nares: paten,t no deformity discharge or tenderness., Ears: no deformity EAC's clear TMs with normal landmarks. Mouth: clear OP, no lesions, edema.  Moist mucous membranes. Dentition in adequate repair. NECK: supple without masses, thyromegaly or bruits. CHEST/PULM:  Clear to auscultation and percussion breath sounds equal no wheeze , rales or rhonchi. No chest wall deformities or tenderness. Decreased breath sounds throughout with increased AP diameter be PMI is nondisplaced Breast: normal by inspection . No dimpling, discharge, masses, tenderness or discharge . CV: PMI is nondisplaced, S1 S2 no gallops, murmurs, rubs. Peripheral pulses are full without delay.No JVD .  ABDOMEN: Bowel sounds normal nontender  No guard or rebound, no hepato splenomegal no CVA tenderness.  No hernia. Extremtities:  No clubbing cyanosis or edema, no acute joint swelling or redness no focal atrophy has significant bunions right more than left but no ulcerations 1 hammertoe. NEURO:  Oriented x3, cranial nerves 3-12 appear to be intact, no obvious focal weakness,gait within normal limits no abnormal reflexes or asymmetrical SKIN: No acute rashes normal turgor, color, no bruising or petechiae. PSYCH: Oriented, good eye contact, no obvious depression anxiety, cognition and judgment appear normal. LN: no cervical axillary inguinal adenopathy No noted deficits in memory, attention, and speech.   Lab Results  Component Value Date   WBC 7.6 09/05/2012   HGB 13.7 09/05/2012   HCT 40.6 09/05/2012   PLT 167.0 09/05/2012   GLUCOSE 95 09/05/2012   CHOL 192 09/05/2012   TRIG 115.0 09/05/2012   HDL 53.10 09/05/2012   LDLDIRECT 135.6 06/13/2009   LDLCALC 116* 09/05/2012   ALT 16 09/05/2012   AST 16 09/05/2012   NA 140 09/05/2012   K 4.7 09/05/2012   CL 105 09/05/2012   CREATININE 0.7 09/05/2012   BUN 20 09/05/2012   CO2 29 09/05/2012   TSH 0.92 09/05/2012    ASSESSMENT AND PLAN:  Discussed the following assessment  and plan:  Visit for preventive health examination - Needs to get eye exam possible bone density check into [facility] with insurance  TOBACCO USER - Discussed cessation - Plan: Hepatic function panel, CBC with Differential, Basic metabolic panel, Lipid panel, TSH, DG Chest 2 View, Vitamin D 25 hydroxy  OSTEOPOROSIS - Taking calcium vitamin D last DEXA years ago - Plan: Hepatic function panel, CBC with Differential, Basic metabolic panel, Lipid panel, TSH, DG Chest 2 View, Vitamin D 25 hydroxy, DG Bone Density  OSTEOARTHRITIS - Plan: Hepatic function panel, CBC with Differential, Basic metabolic panel, Lipid panel, TSH, DG Chest 2 View, Vitamin D 25 hydroxy  HYPERLIPIDEMIA - Plan: Hepatic function panel, CBC with Differential, Basic metabolic panel, Lipid panel, TSH, DG Chest 2 View, Vitamin D 25 hydroxy  COLONIC POLYPS, HX OF -  Plan: Hepatic function panel, CBC with Differential, Basic metabolic panel, Lipid panel, TSH, DG Chest 2 View, Vitamin D 25 hydroxy  Cough, persistent - Suspect COPD symptoms rule out infection other. - Plan: Hepatic function panel, CBC with Differential, Basic metabolic panel, Lipid panel, TSH, DG Chest 2 View, Vitamin D 25 hydroxy  Medicare annual wellness visit, subsequent  Decreased hearing, bilateral - High frequency screening deficit she can consider audiology appointment but will hold off it now loud noise protection  Patient Care Team: Madelin Headings, MD as PCP - General  Patient Instructions  Stopping tobacco is the best thing you can do for your health . Because of the ongoing cough please gets a chest x-ray that we discussed we'll let she know those results.  It is possible you have lung damage from the years of smoking. We could consider getting a lung function tests to see the status of your lungs but if you're having no symptoms I would just tell you to stop smoking anyway.  You're probably due for a bone density scan because of the history of  osteoporosis. Check your vitamin D level to see if this is adequate and we should order a bone density.  Please get an eye examination  contact her insurance carrier about who is in your network.  Check with your insurance about cost and coverage of shingles vaccine and you can come back and get it anytime   Preventive Care for Adults, Female A healthy lifestyle and preventive care can promote health and wellness. Preventive health guidelines for women include the following key practices.  A routine yearly physical is a good way to check with your caregiver about your health and preventive screening. It is a chance to share any concerns and updates on your health, and to receive a thorough exam.  Visit your dentist for a routine exam and preventive care every 6 months. Brush your teeth twice a day and floss once a day. Good oral hygiene prevents tooth decay and gum disease.  The frequency of eye exams is based on your age, health, family medical history, use of contact lenses, and other factors. Follow your caregiver's recommendations for frequency of eye exams.  Eat a healthy diet. Foods like vegetables, fruits, whole grains, low-fat dairy products, and lean protein foods contain the nutrients you need without too many calories. Decrease your intake of foods high in solid fats, added sugars, and salt. Eat the right amount of calories for you.Get information about a proper diet from your caregiver, if necessary.  Regular physical exercise is one of the most important things you can do for your health. Most adults should get at least 150 minutes of moderate-intensity exercise (any activity that increases your heart rate and causes you to sweat) each week. In addition, most adults need muscle-strengthening exercises on 2 or more days a week.  Maintain a healthy weight. The body mass index (BMI) is a screening tool to identify possible weight problems. It provides an estimate of body fat based on  height and weight. Your caregiver can help determine your BMI, and can help you achieve or maintain a healthy weight.For adults 20 years and older:  A BMI below 18.5 is considered underweight.  A BMI of 18.5 to 24.9 is normal.  A BMI of 25 to 29.9 is considered overweight.  A BMI of 30 and above is considered obese.  Maintain normal blood lipids and cholesterol levels by exercising and minimizing your intake of saturated fat.  Eat a balanced diet with plenty of fruit and vegetables. Blood tests for lipids and cholesterol should begin at age 3 and be repeated every 5 years. If your lipid or cholesterol levels are high, you are over 50, or you are at high risk for heart disease, you may need your cholesterol levels checked more frequently.Ongoing high lipid and cholesterol levels should be treated with medicines if diet and exercise are not effective.  If you smoke, find out from your caregiver how to quit. If you do not use tobacco, do not start.  If you are pregnant, do not drink alcohol. If you are breastfeeding, be very cautious about drinking alcohol. If you are not pregnant and choose to drink alcohol, do not exceed 1 drink per day. One drink is considered to be 12 ounces (355 mL) of beer, 5 ounces (148 mL) of wine, or 1.5 ounces (44 mL) of liquor.  Avoid use of street drugs. Do not share needles with anyone. Ask for help if you need support or instructions about stopping the use of drugs.  High blood pressure causes heart disease and increases the risk of stroke. Your blood pressure should be checked at least every 1 to 2 years. Ongoing high blood pressure should be treated with medicines if weight loss and exercise are not effective.  If you are 59 to 75 years old, ask your caregiver if you should take aspirin to prevent strokes.  Diabetes screening involves taking a blood sample to check your fasting blood sugar level. This should be done once every 3 years, after age 37, if you are  within normal weight and without risk factors for diabetes. Testing should be considered at a younger age or be carried out more frequently if you are overweight and have at least 1 risk factor for diabetes.  Breast cancer screening is essential preventive care for women. You should practice "breast self-awareness." This means understanding the normal appearance and feel of your breasts and may include breast self-examination. Any changes detected, no matter how small, should be reported to a caregiver. Women in their 50s and 30s should have a clinical breast exam (CBE) by a caregiver as part of a regular health exam every 1 to 3 years. After age 64, women should have a CBE every year. Starting at age 82, women should consider having a mammography (breast X-ray test) every year. Women who have a family history of breast cancer should talk to their caregiver about genetic screening. Women at a high risk of breast cancer should talk to their caregivers about having magnetic resonance imaging (MRI) and a mammography every year.  The Pap test is a screening test for cervical cancer. A Pap test can show cell changes on the cervix that might become cervical cancer if left untreated. A Pap test is a procedure in which cells are obtained and examined from the lower end of the uterus (cervix).  Women should have a Pap test starting at age 30.  Between ages 64 and 75, Pap tests should be repeated every 2 years.  Beginning at age 3, you should have a Pap test every 3 years as long as the past 3 Pap tests have been normal.  Some women have medical problems that increase the chance of getting cervical cancer. Talk to your caregiver about these problems. It is especially important to talk to your caregiver if a new problem develops soon after your last Pap test. In these cases, your caregiver may recommend more frequent  screening and Pap tests.  The above recommendations are the same for women who have or have not  gotten the vaccine for human papillomavirus (HPV).  If you had a hysterectomy for a problem that was not cancer or a condition that could lead to cancer, then you no longer need Pap tests. Even if you no longer need a Pap test, a regular exam is a good idea to make sure no other problems are starting.  If you are between ages 52 and 67, and you have had normal Pap tests going back 10 years, you no longer need Pap tests. Even if you no longer need a Pap test, a regular exam is a good idea to make sure no other problems are starting.  If you have had past treatment for cervical cancer or a condition that could lead to cancer, you need Pap tests and screening for cancer for at least 20 years after your treatment.  If Pap tests have been discontinued, risk factors (such as a new sexual partner) need to be reassessed to determine if screening should be resumed.  The HPV test is an additional test that may be used for cervical cancer screening. The HPV test looks for the virus that can cause the cell changes on the cervix. The cells collected during the Pap test can be tested for HPV. The HPV test could be used to screen women aged 30 years and older, and should be used in women of any age who have unclear Pap test results. After the age of 66, women should have HPV testing at the same frequency as a Pap test.  Colorectal cancer can be detected and often prevented. Most routine colorectal cancer screening begins at the age of 47 and continues through age 71. However, your caregiver may recommend screening at an earlier age if you have risk factors for colon cancer. On a yearly basis, your caregiver may provide home test kits to check for hidden blood in the stool. Use of a small camera at the end of a tube, to directly examine the colon (sigmoidoscopy or colonoscopy), can detect the earliest forms of colorectal cancer. Talk to your caregiver about this at age 28, when routine screening begins. Direct  examination of the colon should be repeated every 5 to 10 years through age 86, unless early forms of pre-cancerous polyps or small growths are found.  Hepatitis C blood testing is recommended for all people born from [birth year] through 1965 and any individual with known risks for hepatitis C.  Practice safe sex. Use condoms and avoid high-risk sexual practices to reduce the spread of sexually transmitted infections (STIs). STIs include gonorrhea, chlamydia, syphilis, trichomonas, herpes, HPV, and human immunodeficiency virus (HIV). Herpes, HIV, and HPV are viral illnesses that have no cure. They can result in disability, cancer, and death. Sexually active women aged 42 and younger should be checked for chlamydia. Older women with new or multiple partners should also be tested for chlamydia. Testing for other STIs is recommended if you are sexually active and at increased risk.  Osteoporosis is a disease in which the bones lose minerals and strength with aging. This can result in serious bone fractures. The risk of osteoporosis can be identified using a bone density scan. Women ages 73 and over and women at risk for fractures or osteoporosis should discuss screening with their caregivers. Ask your caregiver whether you should take a calcium supplement or vitamin D to reduce the rate of osteoporosis.  Menopause can  be associated with physical symptoms and risks. Hormone replacement therapy is available to decrease symptoms and risks. You should talk to your caregiver about whether hormone replacement therapy is right for you.  Use sunscreen with sun protection factor (SPF) of 30 or more. Apply sunscreen liberally and repeatedly throughout the day. You should seek shade when your shadow is shorter than you. Protect yourself by wearing long sleeves, pants, a wide-brimmed hat, and sunglasses year round, whenever you are outdoors.  Once a month, do a whole body skin exam, using a mirror to look at the skin on your  back. Notify your caregiver of new moles, moles that have irregular borders, moles that are larger than a pencil eraser, or moles that have changed in shape or color.  Stay current with required immunizations.  Influenza. You need a dose every fall (or winter). The composition of the flu vaccine changes each year, so being vaccinated once is not enough.  Pneumococcal polysaccharide. You need 1 to 2 doses if you smoke cigarettes or if you have certain chronic medical conditions. You need 1 dose at age 74 (or older) if you have never been vaccinated.  Tetanus, diphtheria, pertussis (Tdap, Td). Get 1 dose of Tdap vaccine if you are younger than age 9, are over 74 and have contact with an infant, are a Research scientist (physical sciences), are pregnant, or simply want to be protected from whooping cough. After that, you need a Td booster dose every 10 years. Consult your caregiver if you have not had at least 3 tetanus and diphtheria-containing shots sometime in your life or have a deep or dirty wound.  HPV. You need this vaccine if you are a woman age 57 or younger. The vaccine is given in 3 doses over 6 months.  Measles, mumps, rubella (MMR). You need at least 1 dose of MMR if you were born in 1957 or later. You may also need a second dose.  Meningococcal. If you are age 28 to 3 and a first-year college student living in a residence hall, or have one of several medical conditions, you need to get vaccinated against meningococcal disease. You may also need additional booster doses.  Zoster (shingles). If you are age 56 or older, you should get this vaccine.  Varicella (chickenpox). If you have never had chickenpox or you were vaccinated but received only 1 dose, talk to your caregiver to find out if you need this vaccine.  Hepatitis A. You need this vaccine if you have a specific risk factor for hepatitis A virus infection or you simply wish to be protected from this disease. The vaccine is usually given as 2 doses,  6 to 18 months apart.  Hepatitis B. You need this vaccine if you have a specific risk factor for hepatitis B virus infection or you simply wish to be protected from this disease. The vaccine is given in 3 doses, usually over 6 months. Preventive Services / Frequency Ages 86 to 9  Blood pressure check.** / Every 1 to 2 years.  Lipid and cholesterol check.** / Every 5 years beginning at age 61.  Clinical breast exam.** / Every 3 years for women in their 76s and 30s.  Pap test.** / Every 2 years from ages 29 through 13. Every 3 years starting at age 6 through age 80 or 66 with a history of 3 consecutive normal Pap tests.  HPV screening.** / Every 3 years from ages 28 through ages 58 to 9 with a history of 3  consecutive normal Pap tests.  Hepatitis C blood test.** / For any individual with known risks for hepatitis C.  Skin self-exam. / Monthly.  Influenza immunization.** / Every year.  Pneumococcal polysaccharide immunization.** / 1 to 2 doses if you smoke cigarettes or if you have certain chronic medical conditions.  Tetanus, diphtheria, pertussis (Tdap, Td) immunization. / A one-time dose of Tdap vaccine. After that, you need a Td booster dose every 10 years.  HPV immunization. / 3 doses over 6 months, if you are 35 and younger.  Measles, mumps, rubella (MMR) immunization. / You need at least 1 dose of MMR if you were born in 1957 or later. You may also need a second dose.  Meningococcal immunization. / 1 dose if you are age 77 to 44 and a first-year college student living in a residence hall, or have one of several medical conditions, you need to get vaccinated against meningococcal disease. You may also need additional booster doses.  Varicella immunization.** / Consult your caregiver.  Hepatitis A immunization.** / Consult your caregiver. 2 doses, 6 to 18 months apart.  Hepatitis B immunization.** / Consult your caregiver. 3 doses usually over 6 months. Ages 30 to  73  Blood pressure check.** / Every 1 to 2 years.  Lipid and cholesterol check.** / Every 5 years beginning at age 70.  Clinical breast exam.** / Every year after age 69.  Mammogram.** / Every year beginning at age 46 and continuing for as long as you are in good health. Consult with your caregiver.  Pap test.** / Every 3 years starting at age 52 through age 4 or 35 with a history of 3 consecutive normal Pap tests.  HPV screening.** / Every 3 years from ages 47 through ages 46 to 90 with a history of 3 consecutive normal Pap tests.  Fecal occult blood test (FOBT) of stool. / Every year beginning at age 39 and continuing until age 73. You may not need to do this test if you get a colonoscopy every 10 years.  Flexible sigmoidoscopy or colonoscopy.** / Every 5 years for a flexible sigmoidoscopy or every 10 years for a colonoscopy beginning at age 30 and continuing until age 76.  Hepatitis C blood test.** / For all people born from [birth year] through 1963 and any individual with known risks for hepatitis C.  Skin self-exam. / Monthly.  Influenza immunization.** / Every year.  Pneumococcal polysaccharide immunization.** / 1 to 2 doses if you smoke cigarettes or if you have certain chronic medical conditions.  Tetanus, diphtheria, pertussis (Tdap, Td) immunization.** / A one-time dose of Tdap vaccine. After that, you need a Td booster dose every 10 years.  Measles, mumps, rubella (MMR) immunization. / You need at least 1 dose of MMR if you were born in 1957 or later. You may also need a second dose.  Varicella immunization.** / Consult your caregiver.  Meningococcal immunization.** / Consult your caregiver.  Hepatitis A immunization.** / Consult your caregiver. 2 doses, 6 to 18 months apart.  Hepatitis B immunization.** / Consult your caregiver. 3 doses, usually over 6 months. Ages 66 and over  Blood pressure check.** / Every 1 to 2 years.  Lipid and cholesterol check.** / Every 5 years  beginning at age 56.  Clinical breast exam.** / Every year after age 12.  Mammogram.** / Every year beginning at age 6 and continuing for as long as you are in good health. Consult with your caregiver.  Pap test.** / Every 3 years  starting at age 55 through age 42 or 45 with a 3 consecutive normal Pap tests. Testing can be stopped between 65 and 70 with 3 consecutive normal Pap tests and no abnormal Pap or HPV tests in the past 10 years.  HPV screening.** / Every 3 years from ages 50 through ages 35 or 46 with a history of 3 consecutive normal Pap tests. Testing can be stopped between 65 and 70 with 3 consecutive normal Pap tests and no abnormal Pap or HPV tests in the past 10 years.  Fecal occult blood test (FOBT) of stool. / Every year beginning at age 19 and continuing until age 49. You may not need to do this test if you get a colonoscopy every 10 years.  Flexible sigmoidoscopy or colonoscopy.** / Every 5 years for a flexible sigmoidoscopy or every 10 years for a colonoscopy beginning at age 54 and continuing until age 31.  Hepatitis C blood test.** / For all people born from [birth year] through 1963 and any individual with known risks for hepatitis C.  Osteoporosis screening.** / A one-time screening for women ages 7 and over and women at risk for fractures or osteoporosis.  Skin self-exam. / Monthly.  Influenza immunization.** / Every year.  Pneumococcal polysaccharide immunization.** / 1 dose at age 4 (or older) if you have never been vaccinated.  Tetanus, diphtheria, pertussis (Tdap, Td) immunization. / A one-time dose of Tdap vaccine if you are over 65 and have contact with an infant, are a Research scientist (physical sciences), or simply want to be protected from whooping cough. After that, you need a Td booster dose every 10 years.  Varicella immunization.** / Consult your caregiver.  Meningococcal immunization.** / Consult your caregiver.  Hepatitis A immunization.** / Consult your caregiver. 2  doses, 6 to 18 months apart.  Hepatitis B immunization.** / Check with your caregiver. 3 doses, usually over 6 months. ** Family history and personal history of risk and conditions may change your caregiver's recommendations. Document Released: 07/17/2001 Document Revised: 08/13/2011 Document Reviewed: 10/16/2010 [facility] Patient Information 2013 [place], [place].        Neta Mends. Panosh M.D.  Health Maintenance  Topic Date Due  . Zostavax  07/28/1997  . Influenza Vaccine  02/02/2013  . Tetanus/tdap  09/26/2014  . Colonoscopy  05/11/2019  . Pneumococcal Polysaccharide Vaccine Age 58 And Over  Completed   Health Maintenance Review

## 2012-09-10 ENCOUNTER — Telehealth: Payer: Self-pay | Admitting: Internal Medicine

## 2012-09-10 NOTE — Telephone Encounter (Signed)
After attempting to reach the patient for a few days now to schedule her bone density scan, she called me back this morning. She stated that she will not be taking the medication that she is required to take for the contrast of a bone density scan. Therefor she would like to know if its still necessary to schedule it. Please assist.

## 2012-09-10 NOTE — Telephone Encounter (Signed)
There is nor contrast for a vone density    . However if she doesn't want the test  We wont do it .  Put declined on the health maintenance area.   also please see  The chest x rya result note also to discuss with her.

## 2012-09-10 NOTE — Telephone Encounter (Signed)
Noted.  Order removed.

## 2012-10-03 ENCOUNTER — Encounter: Payer: Self-pay | Admitting: Family Medicine

## 2012-11-24 ENCOUNTER — Encounter: Payer: Self-pay | Admitting: Internal Medicine

## 2012-12-03 ENCOUNTER — Encounter: Payer: Self-pay | Admitting: Internal Medicine

## 2013-10-21 ENCOUNTER — Telehealth: Payer: Self-pay | Admitting: Internal Medicine

## 2013-10-21 ENCOUNTER — Telehealth: Payer: Self-pay

## 2013-10-21 ENCOUNTER — Ambulatory Visit (INDEPENDENT_AMBULATORY_CARE_PROVIDER_SITE_OTHER): Payer: Medicare Other | Admitting: Internal Medicine

## 2013-10-21 ENCOUNTER — Encounter: Payer: Self-pay | Admitting: Internal Medicine

## 2013-10-21 VITALS — BP 98/60 | HR 88 | Temp 99.1°F | Ht 58.75 in | Wt 123.0 lb

## 2013-10-21 DIAGNOSIS — H9190 Unspecified hearing loss, unspecified ear: Secondary | ICD-10-CM

## 2013-10-21 DIAGNOSIS — F17200 Nicotine dependence, unspecified, uncomplicated: Secondary | ICD-10-CM

## 2013-10-21 DIAGNOSIS — Z860100 Personal history of colon polyps, unspecified: Secondary | ICD-10-CM

## 2013-10-21 DIAGNOSIS — Z0000 Encounter for general adult medical examination without abnormal findings: Secondary | ICD-10-CM

## 2013-10-21 DIAGNOSIS — Z8719 Personal history of other diseases of the digestive system: Secondary | ICD-10-CM

## 2013-10-21 DIAGNOSIS — E785 Hyperlipidemia, unspecified: Secondary | ICD-10-CM

## 2013-10-21 DIAGNOSIS — M1990 Unspecified osteoarthritis, unspecified site: Secondary | ICD-10-CM

## 2013-10-21 DIAGNOSIS — R9389 Abnormal findings on diagnostic imaging of other specified body structures: Secondary | ICD-10-CM

## 2013-10-21 DIAGNOSIS — R918 Other nonspecific abnormal finding of lung field: Secondary | ICD-10-CM

## 2013-10-21 DIAGNOSIS — Z23 Encounter for immunization: Secondary | ICD-10-CM

## 2013-10-21 LAB — CBC WITH DIFFERENTIAL/PLATELET
BASOS PCT: 0.4 % (ref 0.0–3.0)
Basophils Absolute: 0 10*3/uL (ref 0.0–0.1)
Eosinophils Absolute: 0.3 10*3/uL (ref 0.0–0.7)
Eosinophils Relative: 2.7 % (ref 0.0–5.0)
HCT: 40.7 % (ref 36.0–46.0)
HEMOGLOBIN: 13.7 g/dL (ref 12.0–15.0)
LYMPHS PCT: 26.4 % (ref 12.0–46.0)
Lymphs Abs: 2.6 10*3/uL (ref 0.7–4.0)
MCHC: 33.6 g/dL (ref 30.0–36.0)
MCV: 93 fl (ref 78.0–100.0)
MONO ABS: 0.7 10*3/uL (ref 0.1–1.0)
MONOS PCT: 6.6 % (ref 3.0–12.0)
Neutro Abs: 6.4 10*3/uL (ref 1.4–7.7)
Neutrophils Relative %: 63.9 % (ref 43.0–77.0)
Platelets: 183 10*3/uL (ref 150.0–400.0)
RBC: 4.38 Mil/uL (ref 3.87–5.11)
RDW: 13.4 % (ref 11.5–15.5)
WBC: 10 10*3/uL (ref 4.0–10.5)

## 2013-10-21 LAB — BASIC METABOLIC PANEL
BUN: 13 mg/dL (ref 6–23)
CO2: 26 mEq/L (ref 19–32)
Calcium: 9.8 mg/dL (ref 8.4–10.5)
Chloride: 107 mEq/L (ref 96–112)
Creatinine, Ser: 0.8 mg/dL (ref 0.4–1.2)
GFR: 75.16 mL/min (ref >60.00)
Glucose, Bld: 88 mg/dL (ref 70–99)
Potassium: 3.8 mEq/L (ref 3.5–5.1)
SODIUM: 140 meq/L (ref 135–145)

## 2013-10-21 LAB — TSH: TSH: 0.89 u[IU]/mL (ref 0.35–4.50)

## 2013-10-21 NOTE — Telephone Encounter (Signed)
Left message for pt to call back  °

## 2013-10-21 NOTE — Telephone Encounter (Signed)
Message copied by Algernon Huxley on Wed Oct 21, 2013  1:17 PM ------      Message from: Irene Shipper      Created: Wed Oct 21, 2013  1:13 PM      Regarding: RE: colonoscopy or OV first        Mariann Laster, I have reviewed the chart. I will ask my nurse, Vaughan Basta, to set the patient up for direct colonoscopy at this time to evaluate the rectal bleeding and provide polyp surveillance. Thank you. John            ----- Message -----         From: Burnis Medin, MD         Sent: 10/21/2013  12:11 PM           To: Irene Shipper, MD      Subject: colonoscopy or OV first                                  Hi  JP      This lady has had intermittent blood that she thinks is hemorrhoids but has hx poluyps and due for colon this year .      Do you need to see her? Or should she just have a colonscopy now instead of December.       Cbc pending      Please let her know or me about next step.      Thanks       WP       ------

## 2013-10-21 NOTE — Telephone Encounter (Signed)
Relevant patient education mailed to patient.  

## 2013-10-21 NOTE — Patient Instructions (Addendum)
Will flag dr Henrene Pastor about  Whether he needs to see you or just get a colonoscopy t YOu are due this year. Will bel contacted about lung function tests to see if you have emphysema and lung damage from tobacco. Continue lifestyle intervention healthy eating and exercise .and stop tobacco.  Lab to day to check for anemia .  Recheck in a year or as needed otherwise

## 2013-10-21 NOTE — Progress Notes (Signed)
Chief Complaint  Patient presents with  . Medicare Wellness    HPI: Patient comes in today for Preventive Medicare wellness visit . No major injuries, ed visits ,hospitalizations , new medications since last visit. smoking in evening  8-10 per day . Zyrtec q d  .  For sinus pressure  Had bone density  Vit d and calcium .  Decline medication.  LIFESTYLE:  Exercise:   Tennis 3 x per week   Exercise class  Tobacco/ETS:8 per day  Alcohol: per day 0   Sugar beverages:no Sleep:ok  Drug use: no Bone density: ? 2014  Declines medication takes vit d  Colonoscopy: ? 2010    Health Maintenance  Topic Date Due  . Zostavax  07/28/1997  . Influenza Vaccine  01/02/2014  . Tetanus/tdap  09/26/2014  . Colonoscopy  05/11/2019  . Pneumococcal Polysaccharide Vaccine Age 29 And Over  Completed   Health Maintenance Review   Hearing: still off using amplifier   Vision:  No limitations at present . Last eye check UTD has cataract   Safety:  Has smoke detector and wears seat belts.  No firearms. No excess sun exposure. Sees dentist regularly.  Falls: no  Advance directive :  Reviewed  Has hcpoa  Memory: Felt to be good  , no concern from her or her family.  Depression: No anhedonia unusual crying or depressive symptoms  Nutrition: Eats well balanced diet; adequate calcium and vitamin D. No swallowing chewing problems.  Injury: no major injuries in the last six months.  Other healthcare providers:  Reviewed today .  Social:  Lives alone. .   Preventive parameters: up-to-date  Reviewed   ADLS:   There are no problems or need for assistance  driving, feeding, obtaining food, dressing, toileting and bathing, managing money using phone. She is independent.  ROS:  GEN/ HEENT: No fever, significant weight changes sweats headaches vision problems CV/ PULM; No chest pain shortness of breath cough, syncope,edema  change in exercise tolerance. GI /GU: No adominal pain, vomiting, change  in bowel habits.   sometimes BLOODwhen wipes  Tissue fresh. No significant GU symptoms.    SKIN/HEME: ,no acute skin rashes suspicious lesions or bleeding. No lymphadenopathy, nodules, masses.  NEURO/ PSYCH:  No neurologic signs such as weakness numbness. No depression anxiety. IMM/ Allergy: No unusual infections.  Allergy .   REST of 12 system review negative except as per HPI   Past Medical History  Diagnosis Date  . History of arm fracture     Left  . Diverticulosis   . Colon polyp, hyperplastic   . Osteoarthritis   . Osteoporosis     DEXA 2007-3.2 spine -1.6 hip  . Environmental allergies   History reviewed. No pertinent past surgical history.   Family History  Problem Relation Age of Onset  . Breast cancer    . Lung disease    . Stroke Father 66  . Prostate cancer Father   . Other Mother     Died MVA sister died MVA age 58    History   Social History  . Marital Status: Divorced    Spouse Name: N/A    Number of Children: N/A  . Years of Education: N/A   Social History Main Topics  . Smoking status: Current Some Day Smoker  . Smokeless tobacco: None  . Alcohol Use: None  . Drug Use: None  . Sexual Activity: None   Other Topics Concern  . None   Social History  Narrative   Childbirth x2 g2 p2    Negative alcohol tobacco times many years 8 to 10 in the evening   Does do regular exercise seat belt use.   Divorced widowed Public house manager   No pets    No outpatient encounter prescriptions on file as of 10/21/2013.    EXAM:  BP 98/60  Pulse 88  Temp(Src) 99.1 F (37.3 C) (Oral)  Ht 4' 10.75" (1.492 m)  Wt 123 lb (55.792 kg)  BMI 25.06 kg/m2  SpO2 98%  Body mass index is 25.06 kg/(m^2).  Physical Exam: Vital signs reviewed WEX:HBZJ is a well-developed well-nourished alert cooperative   who appears stated age in no acute distress.  HEENT: normocephalic atraumatic , Eyes: PERRL EOM's full, conjunctiva clear, Nares: paten,t no deformity  discharge or tenderness., Ears: no deformity EAC's clear TMs with normal landmarks. Mouth: clear OP, no lesions, edema.  Moist mucous membranes. Dentition in adequate repair. NECK: supple without masses, thyromegaly or bruits. CHEST/PULM:  Clear to auscultation and percussion breath sounds equal no wheeze , rales or rhonchi. No chest wall deformities or tenderness. Breast: normal by inspection . No dimpling, discharge, masses, tenderness or discharge . CV: PMI is nondisplaced, S1 S2 no gallops, murmurs, rubs. Peripheral pulses are full without delay.No JVD . Breast: normal by inspection . No dimpling, discharge, masses, tenderness or discharge .  ABDOMEN: Bowel sounds normal nontender  No guard or rebound, no hepato splenomegal no CVA tenderness.  No hernia. Extremtities:  No clubbing cyanosis or edema, no acute joint swelling or redness no focal atrophy poa changes  NEURO:  Oriented x3, cranial nerves 3-12 appear to be intact, no obvious focal weakness,gait within normal limits no abnormal reflexes or asymmetrical SKIN: No acute rashes normal turgor, color, no bruising or petechiae.multiple sks no susp moles obvious PSYCH: Oriented, good eye contact, no obvious depression anxiety, cognition and judgment appear normal. LN: no cervical axillary inguinal adenopathy No noted deficits in memory, attention, and speech.   Lab Results  Component Value Date   WBC 10.0 10/21/2013   HGB 13.7 10/21/2013   HCT 40.7 10/21/2013   PLT 183.0 10/21/2013   GLUCOSE 88 10/21/2013   CHOL 192 09/05/2012   TRIG 115.0 09/05/2012   HDL 53.10 09/05/2012   LDLDIRECT 135.6 06/13/2009   LDLCALC 116* 09/05/2012   ALT 16 09/05/2012   AST 16 09/05/2012   NA 140 10/21/2013   K 3.8 10/21/2013   CL 107 10/21/2013   CREATININE 0.8 10/21/2013   BUN 13 10/21/2013   CO2 26 10/21/2013   TSH 0.89 10/21/2013    ASSESSMENT AND PLAN:  Discussed the following assessment and plan:  Visit for preventive health examination - Plan: Basic metabolic  panel, CBC with Differential, TSH  TOBACCO USER - advised stop - Plan: Basic metabolic panel, CBC with Differential, TSH, Pulmonary function test  HYPERLIPIDEMIA - Plan: Basic metabolic panel, CBC with Differential, TSH  OSTEOARTHRITIS - stable not limiting - Plan: Basic metabolic panel, CBC with Differential, TSH  Need for vaccination with 13-polyvalent pneumococcal conjugate vaccine - Plan: Pneumococcal conjugate vaccine 13-valent  History of rectal bleeding - pt attributes to hemorrhoids but recurrent and due for colon this year  - Plan: Basic metabolic panel, CBC with Differential, TSH  Abnormal chest x-ray - EMPHYSEMA ON CHEST X RAY limited smoker - Plan: Pulmonary function test  Personal history of colonic polyps  Decreased hearing - using amplifier  Patient Care Team: Burnis Medin, MD as PCP -  General  Patient Instructions  Will flag dr Henrene Pastor about  Whether he needs to see you or just get a colonoscopy t YOu are due this year. Will bel contacted about lung function tests to see if you have emphysema and lung damage from tobacco. Continue lifestyle intervention healthy eating and exercise .and stop tobacco.  Lab to day to check for anemia .  Recheck in a year or as needed otherwise    Mariann Laster K. Tinsley Lomas M.D.   Pre visit review using our clinic review tool, if applicable. No additional management support is needed unless otherwise documented below in the visit note.

## 2013-10-23 ENCOUNTER — Encounter: Payer: Self-pay | Admitting: Internal Medicine

## 2013-10-23 NOTE — Telephone Encounter (Signed)
Left message for pt to call back  °

## 2013-10-27 NOTE — Telephone Encounter (Signed)
Pt scheduled for previsit and colon. 

## 2013-11-15 ENCOUNTER — Other Ambulatory Visit: Payer: Self-pay | Admitting: Oncology

## 2013-11-20 LAB — HM MAMMOGRAPHY

## 2013-11-24 ENCOUNTER — Ambulatory Visit (AMBULATORY_SURGERY_CENTER): Payer: Self-pay | Admitting: *Deleted

## 2013-11-24 VITALS — Ht <= 58 in | Wt 123.2 lb

## 2013-11-24 DIAGNOSIS — Z86010 Personal history of colonic polyps: Secondary | ICD-10-CM

## 2013-11-24 MED ORDER — MOVIPREP 100 G PO SOLR: ORAL | Status: DC

## 2013-11-24 NOTE — Progress Notes (Signed)
No allergies to eggs or soy. No pior anesthesia.  No problems with sedation  Pt given Emmi instructions for colonoscopy  No oxygen use  No diet drug use

## 2013-11-30 ENCOUNTER — Encounter: Payer: Self-pay | Admitting: Family Medicine

## 2013-12-02 ENCOUNTER — Encounter: Payer: Self-pay | Admitting: Internal Medicine

## 2013-12-08 ENCOUNTER — Ambulatory Visit (AMBULATORY_SURGERY_CENTER): Payer: Medicare Other | Admitting: Internal Medicine

## 2013-12-08 ENCOUNTER — Encounter: Payer: Self-pay | Admitting: Internal Medicine

## 2013-12-08 VITALS — BP 121/74 | HR 65 | Temp 98.8°F | Resp 20 | Ht <= 58 in | Wt 123.0 lb

## 2013-12-08 DIAGNOSIS — Z86010 Personal history of colonic polyps: Secondary | ICD-10-CM

## 2013-12-08 MED ORDER — SODIUM CHLORIDE 0.9 % IV SOLN: 500.0000 mL | INTRAVENOUS | Status: DC

## 2013-12-08 NOTE — Patient Instructions (Signed)
YOU HAD AN ENDOSCOPIC PROCEDURE TODAY AT THE [facility]: Refer to the procedure report that was given to you for any specific questions about what was found during the examination.  If the procedure report does not answer your questions, please call your gastroenterologist to clarify.  If you requested that your care partner not be given the details of your procedure findings, then the procedure report has been included in a sealed envelope for you to review at your convenience later.  YOU SHOULD EXPECT: Some feelings of bloating in the abdomen. Passage of more gas than usual.  Walking can help get rid of the air that was put into your GI tract during the procedure and reduce the bloating. If you had a lower endoscopy (such as a colonoscopy or flexible sigmoidoscopy) you may notice spotting of blood in your stool or on the toilet paper. If you underwent a bowel prep for your procedure, then you may not have a normal bowel movement for a few days.  DIET: Your first meal following the procedure should be a light meal and then it is ok to progress to your normal diet.  A half-sandwich or bowl of soup is an example of a good first meal.  Heavy or fried foods are harder to digest and may make you feel nauseous or bloated.  Likewise meals heavy in dairy and vegetables can cause extra gas to form and this can also increase the bloating.  Drink plenty of fluids but you should avoid alcoholic beverages for 24 hours.  ACTIVITY: Your care partner should take you home directly after the procedure.  You should plan to take it easy, moving slowly for the rest of the day.  You can resume normal activity the day after the procedure however you should NOT DRIVE or use heavy machinery for 24 hours (because of the sedation medicines used during the test).    SYMPTOMS TO REPORT IMMEDIATELY: A gastroenterologist can be reached at any hour.  During normal business hours, 8:30 AM to 5:00 PM Monday through Friday,  call [phone].  After hours and on weekends, please call the GI answering service at [phone] who will take a message and have the physician on call contact you.   Following lower endoscopy (colonoscopy or flexible sigmoidoscopy):  Excessive amounts of blood in the stool  Significant tenderness or worsening of abdominal pains  Swelling of the abdomen that is new, acute  Fever of 100F or higher  FOLLOW UP: If any biopsies were taken you will be contacted by phone or by letter within the next 1-3 weeks.  Call your gastroenterologist if you have not heard about the biopsies in 3 weeks.  Our staff will call the home number listed on your records the next business day following your procedure to check on you and address any questions or concerns that you may have at that time regarding the information given to you following your procedure. This is a courtesy call and so if there is no answer at the home number and we have not heard from you through the emergency physician on call, we will assume that you have returned to your regular daily activities without incident.  SIGNATURES/CONFIDENTIALITY: You and/or your care partner have signed paperwork which will be entered into your electronic medical record.  These signatures attest to the fact that that the information above on your After Visit Summary has been reviewed and is understood.  Full responsibility of the confidentiality of this   discharge information lies with you and/or your care-partner.  Recommendations Return to care of your primary care provider. GI follow up as needed.

## 2013-12-08 NOTE — Op Note (Signed)
[place]  [address]. [place], [address]   COLONOSCOPY PROCEDURE REPORT  PATIENT: Megan Lucas, Megan Lucas  MR#: [id number] BIRTHDATE: 1935-08-09 , 78  yrs. old GENDER: Female ENDOSCOPIST: Eustace Quail, MD REFERRED [id number] Program Recall PROCEDURE DATE:  12/08/2013 PROCEDURE:   Colonoscopy, surveillance First Screening Colonoscopy - Avg.  risk and is 50 yrs.  old or older - No.  Prior Negative Screening - Now for repeat screening. N/A  History of Adenoma - Now for follow-up colonoscopy & has been > or = to 3 yrs.  N/A  Polyps Removed Today? No.  Recommend repeat exam, <10 yrs? No. ASA CLASS:   Class II INDICATIONS:Patient's personal history of colon polyps.   Prior exams 2005, 2010 (large sessile HPP) MEDICATIONS: MAC sedation, administered by CRNA and propofol (Diprivan) 150mg  IV  DESCRIPTION OF PROCEDURE:   After the risks benefits and alternatives of the procedure were thoroughly explained, informed consent was obtained.  A digital rectal exam revealed no abnormalities of the rectum.   The LB [id number]  endoscope was introduced through the anus and advanced to the cecum, which was identified by both the appendix and ileocecal valve. No adverse events experienced.   The quality of the prep was excellent, using MoviPrep  The instrument was then slowly withdrawn as the colon was fully examined.    COLON FINDINGS: Severe diverticulosis was noted throughout the entire examined colon.   The colon was otherwise normal.  There was no diverticulosis, inflammation, polyps or cancers unless previously stated.  Retroflexed views revealed internal hemorrhoids. The time to cecum=6 minutes 18 seconds.  Withdrawal time=8 minutes 24 seconds.  The scope was withdrawn and the procedure completed.  COMPLICATIONS: There were no complications.  ENDOSCOPIC IMPRESSION: 1.   Severe diverticulosis was noted throughout the entire examined colon 2.   The colon was  otherwise normal  RECOMMENDATIONS: 1. Return to the care of your primary provider.  GI follow up as needed   eSigned:  Eustace Quail, MD 12/08/2013 10:20 AM   cc: Burnis Medin, MD and The Patient

## 2013-12-08 NOTE — Progress Notes (Signed)
A/ox3 pleased with MAC, report to Wendy RN 

## 2013-12-09 ENCOUNTER — Telehealth: Payer: Self-pay

## 2013-12-09 NOTE — Telephone Encounter (Signed)
Left a message at [phone] for the pt to call back if any questions or concerns. Maw

## 2014-01-19 ENCOUNTER — Encounter: Payer: Self-pay | Admitting: Internal Medicine

## 2014-06-09 DIAGNOSIS — H2513 Age-related nuclear cataract, bilateral: Secondary | ICD-10-CM | POA: Diagnosis not present

## 2014-07-07 DIAGNOSIS — H2513 Age-related nuclear cataract, bilateral: Secondary | ICD-10-CM | POA: Diagnosis not present

## 2014-07-22 DIAGNOSIS — H2512 Age-related nuclear cataract, left eye: Secondary | ICD-10-CM | POA: Diagnosis not present

## 2014-07-22 DIAGNOSIS — H25812 Combined forms of age-related cataract, left eye: Secondary | ICD-10-CM | POA: Diagnosis not present

## 2014-09-22 DIAGNOSIS — Z961 Presence of intraocular lens: Secondary | ICD-10-CM | POA: Diagnosis not present

## 2014-09-22 DIAGNOSIS — H43812 Vitreous degeneration, left eye: Secondary | ICD-10-CM | POA: Diagnosis not present

## 2014-12-17 DIAGNOSIS — Z1231 Encounter for screening mammogram for malignant neoplasm of breast: Secondary | ICD-10-CM | POA: Diagnosis not present

## 2014-12-17 LAB — HM MAMMOGRAPHY

## 2014-12-21 ENCOUNTER — Encounter: Payer: Self-pay | Admitting: Family Medicine

## 2015-12-23 DIAGNOSIS — Z1231 Encounter for screening mammogram for malignant neoplasm of breast: Secondary | ICD-10-CM | POA: Diagnosis not present

## 2015-12-23 LAB — HM MAMMOGRAPHY

## 2015-12-26 ENCOUNTER — Encounter: Payer: Self-pay | Admitting: Family Medicine

## 2016-01-20 ENCOUNTER — Ambulatory Visit (INDEPENDENT_AMBULATORY_CARE_PROVIDER_SITE_OTHER): Payer: Medicare Other | Admitting: Internal Medicine

## 2016-01-20 ENCOUNTER — Encounter: Payer: Self-pay | Admitting: Internal Medicine

## 2016-01-20 VITALS — BP 114/66 | Temp 98.7°F | Ht <= 58 in | Wt 119.4 lb

## 2016-01-20 DIAGNOSIS — L819 Disorder of pigmentation, unspecified: Secondary | ICD-10-CM | POA: Diagnosis not present

## 2016-01-20 DIAGNOSIS — R112 Nausea with vomiting, unspecified: Secondary | ICD-10-CM

## 2016-01-20 DIAGNOSIS — F17200 Nicotine dependence, unspecified, uncomplicated: Secondary | ICD-10-CM

## 2016-01-20 DIAGNOSIS — M546 Pain in thoracic spine: Secondary | ICD-10-CM

## 2016-01-20 LAB — CBC WITH DIFFERENTIAL/PLATELET
BASOS PCT: 0.5 % (ref 0.0–3.0)
Basophils Absolute: 0 10*3/uL (ref 0.0–0.1)
EOS ABS: 0.3 10*3/uL (ref 0.0–0.7)
Eosinophils Relative: 3.4 % (ref 0.0–5.0)
HEMATOCRIT: 40.6 % (ref 36.0–46.0)
Hemoglobin: 13.6 g/dL (ref 12.0–15.0)
LYMPHS ABS: 2.5 10*3/uL (ref 0.7–4.0)
LYMPHS PCT: 29.6 % (ref 12.0–46.0)
MCHC: 33.4 g/dL (ref 30.0–36.0)
MCV: 92.2 fl (ref 78.0–100.0)
Monocytes Absolute: 0.6 10*3/uL (ref 0.1–1.0)
Monocytes Relative: 6.8 % (ref 3.0–12.0)
NEUTROS ABS: 5 10*3/uL (ref 1.4–7.7)
NEUTROS PCT: 59.7 % (ref 43.0–77.0)
PLATELETS: 198 10*3/uL (ref 150.0–400.0)
RBC: 4.4 Mil/uL (ref 3.87–5.11)
RDW: 13.3 % (ref 11.5–15.5)
WBC: 8.4 10*3/uL (ref 4.0–10.5)

## 2016-01-20 LAB — HEPATIC FUNCTION PANEL
ALK PHOS: 58 U/L (ref 39–117)
ALT: 13 U/L (ref 0–35)
AST: 15 U/L (ref 0–37)
Albumin: 4.1 g/dL (ref 3.5–5.2)
BILIRUBIN DIRECT: 0.1 mg/dL (ref 0.0–0.3)
TOTAL PROTEIN: 6.4 g/dL (ref 6.0–8.3)
Total Bilirubin: 0.4 mg/dL (ref 0.2–1.2)

## 2016-01-20 LAB — POC URINALSYSI DIPSTICK (AUTOMATED)
BILIRUBIN UA: NEGATIVE
GLUCOSE UA: NEGATIVE
Ketones, UA: NEGATIVE
Leukocytes, UA: NEGATIVE
Nitrite, UA: NEGATIVE
SPEC GRAV UA: 1.015
Urobilinogen, UA: 0.2
pH, UA: 7.5

## 2016-01-20 LAB — C-REACTIVE PROTEIN: CRP: 0.2 mg/dL — ABNORMAL LOW (ref 0.5–20.0)

## 2016-01-20 LAB — BASIC METABOLIC PANEL
BUN: 15 mg/dL (ref 6–23)
CHLORIDE: 105 meq/L (ref 96–112)
CO2: 30 meq/L (ref 19–32)
CREATININE: 0.75 mg/dL (ref 0.40–1.20)
Calcium: 10.2 mg/dL (ref 8.4–10.5)
GFR: 79.33 mL/min (ref >60.00)
Glucose, Bld: 84 mg/dL (ref 70–99)
Potassium: 3.6 mEq/L (ref 3.5–5.1)
SODIUM: 142 meq/L (ref 135–145)

## 2016-01-20 NOTE — Progress Notes (Signed)
Chief Complaint  Patient presents with  . Vomiting    HPI: Megan Lucas 80 y.o.  Comes in today for an acute visit. She has had some recurrent episodes of vomiting for apparently no reason without associated abdominal pain fever or diarrhea or illness. She had an episode in April or May after going to a restaurant and had an episode of vigorous vomiting when she was in the parking lot after she recovered she did not have residual nausea or other problems area within this week 2 days ago she had sudden onset of vomiting again and then repeated it in the evening. Again no abdominal pain with it fever bleeding change in bowel habits otherwise. She did take an ibuprofen at one point but not during most of these episodes. She's had some ongoing last lower thorax pain almost in flank for quite a while and self treats it with topical lidocaine. She is able to play tennis at times. She states that areas more like an ache doesn't radiate to her abdomen but perhaps to her side no rashes. Is more like a nagging symptom Continues to have small amount of tobacco maximum 10 a day. States that she doesn't inhale most of them in no respiratory symptoms ROS: See pertinent positives and negatives per HPI.  Past Medical History:  Diagnosis Date  . Cataract   . Colon polyp, hyperplastic   . Diverticulosis   . Environmental allergies   . History of arm fracture    Left  . Osteoarthritis   . Osteoporosis    DEXA 2007-3.2 spine -1.6 hip    Family History  Problem Relation Age of Onset  . Breast cancer    . Lung disease    . Stroke Father 61  . Prostate cancer Father   . Other Mother     Died MVA sister died MVA age 2  . Colon cancer Neg Hx     Social History   Social History  . Marital status: Divorced    Spouse name: N/A  . Number of children: N/A  . Years of education: N/A   Social History Main Topics  . Smoking status: Current Every Day Smoker    Packs/day: 0.50    Types: Cigarettes    . Smokeless tobacco: Never Used  . Alcohol use No  . Drug use: No  . Sexual activity: Not Asked   Other Topics Concern  . None   Social History Narrative   Childbirth x2 g2 p2    Negative alcohol tobacco times many years 8 to 10 in the evening   Does do regular exercise seat belt use.   Divorced widowed Public house manager   No pets    Outpatient Medications Prior to Visit  Medication Sig Dispense Refill  . Calcium Carbonate-Vit D-Min (CALCIUM 1200 PO) Take by mouth 2 (two) times daily.    Marland Kitchen glucosamine-chondroitin 500-400 MG tablet Take 1 tablet by mouth 2 (two) times daily.    . Multiple Vitamin (MULTIVITAMIN) tablet Take 1 tablet by mouth daily.    . Omega-3 Fatty Acids (FISH OIL PO) Take by mouth daily.    Marland Kitchen POTASSIUM CITRATE PO Take by mouth daily.     No facility-administered medications prior to visit.      EXAM:  BP 114/66 (BP Location: Right Arm, Patient Position: Sitting, Cuff Size: Normal)   Temp 98.7 F (37.1 C) (Oral)   Ht 4\' 10"  (1.473 m)   Wt 119 lb 6.4 oz (  54.2 kg)   BMI 24.95 kg/m   Body mass index is 24.95 kg/m.  GENERAL: vitals reviewed and listed above, alert, oriented, appears well hydrated and in no acute distress HEENT: atraumatic, conjunctiva  clear, no obvious abnormalities on inspection of external nose and ears OP : no lesion edema or exudate  NECK: no obvious masses on inspection palpation  LUNGS: clear to auscultation bilaterally, no wheezes, rales or rhonchi, good air movement mild kyphosis  No cva tendernses CV: HRRR, no clubbing cyanosis or  peripheral edema nl cap refill  Abdomen:  Sof,t normal bowel sounds without hepatosplenomegaly, no guarding rebound or masses no CVA tenderness  inc BS but no masses  MS: moves all extremitiesabnormality SKIN: Nonicteric. In the middle of her back she has a 3 mm round partly elevated very dark mole. PSYCH: pleasant and cooperative, no obvious depression or anxiety Lab Results   Component Value Date   WBC 8.4 01/20/2016   HGB 13.6 01/20/2016   HCT 40.6 01/20/2016   PLT 198.0 01/20/2016   GLUCOSE 84 01/20/2016   CHOL 192 09/05/2012   TRIG 115.0 09/05/2012   HDL 53.10 09/05/2012   LDLDIRECT 135.6 06/13/2009   LDLCALC 116 (H) 09/05/2012   ALT 13 01/20/2016   AST 15 01/20/2016   NA 142 01/20/2016   K 3.6 01/20/2016   CL 105 01/20/2016   CREATININE 0.75 01/20/2016   BUN 15 01/20/2016   CO2 30 01/20/2016   TSH 0.89 10/21/2013  Review of records does show that she has an old gallbladder polyp about 7 years ago 4 mm.  ASSESSMENT AND PLAN:  Discussed the following assessment and plan:  Nausea and vomiting, intractability of vomiting not specified, unspecified vomiting type - Curious episodes but vomiting never normal does not sound like a CNS vomiting consideration of ulcer disease obstruction other not typical no weight loss see te - Plan: Basic metabolic panel, Hepatic function panel, POCT Urinalysis Dipstick (Automated), C-reactive protein, CBC with Differential/Platelet, DG Chest 2 View, US Abdomen Complete  TOBACCO USER - Plan: DG Chest 2 View  Left-sided thoracic back pain - Plan: DG Chest 2 View, US Abdomen Complete  Pigmented skin lesion of uncertain nature  Finances are limiting somewhat  Can wait on the derm check but   Gi referral  Would advise  See plan below  Has seen dr Henrene Pastor in the past  -Patient advised to return or notify health care team  if symptoms worsen ,persist or new concerns arise.  Patient Instructions  Uncertain cause of your vomiting that your exam is reassuring. Gallbladder problems usually have more pain associated with this. Blood work urine test today and someone will contact you about getting an abdominal ultrasound. Please get a chest x-ray also because of the left back pain although it probably is a musculoskeletal cause. Depending on results we will probably have you see a gastroenterology. For now avoid Advil  Aleve-type products. Get back with [place] earlier if things are getting worse.  Uncertain about the dark mole on your back that looks benign except for the very dark color. Please have dermatologist check this. This is unrelated to your vomiting. Skin Surgery [facility]  [address], [place], [place] [address] [phone]     Standley Brooking. Panosh M.D.

## 2016-01-20 NOTE — Progress Notes (Signed)
Pre visit review using our clinic review tool, if applicable. No additional management support is needed unless otherwise documented below in the visit note. 

## 2016-01-20 NOTE — Patient Instructions (Signed)
Uncertain cause of your vomiting that your exam is reassuring. Gallbladder problems usually have more pain associated with this. Blood work urine test today and someone will contact you about getting an abdominal ultrasound. Please get a chest x-ray also because of the left back pain although it probably is a musculoskeletal cause. Depending on results we will probably have you see a gastroenterology. For now avoid Advil Aleve-type products. Get back with [place] earlier if things are getting worse.  Uncertain about the dark mole on your back that looks benign except for the very dark color. Please have dermatologist check this. This is unrelated to your vomiting. Skin Surgery [facility]  [address], [place], [place] [address] [phone]

## 2016-01-27 ENCOUNTER — Telehealth: Payer: Self-pay

## 2016-01-27 NOTE — Telephone Encounter (Signed)
Called patient. No answer. Called daughter. She will have patient to call the office.

## 2016-01-27 NOTE — Telephone Encounter (Signed)
-----   Message from Burnis Medin, MD sent at 01/24/2016 10:22 AM EDT ----- Blood owrk is normal  And reassuring   Proceed with  Imaging as planned.

## 2016-02-03 ENCOUNTER — Ambulatory Visit
Admission: RE | Admit: 2016-02-03 | Discharge: 2016-02-03 | Disposition: A | Discharge status: Home / Self Care | Payer: Medicare Other | Source: Ambulatory Visit | Attending: Internal Medicine | Admitting: Internal Medicine

## 2016-02-03 DIAGNOSIS — M546 Pain in thoracic spine: Secondary | ICD-10-CM

## 2016-02-03 DIAGNOSIS — K7689 Other specified diseases of liver: Secondary | ICD-10-CM | POA: Diagnosis not present

## 2016-02-03 DIAGNOSIS — R112 Nausea with vomiting, unspecified: Secondary | ICD-10-CM

## 2016-02-03 DIAGNOSIS — K824 Cholesterolosis of gallbladder: Secondary | ICD-10-CM | POA: Diagnosis not present

## 2016-02-18 NOTE — Progress Notes (Signed)
Pre visit review using our clinic review tool, if applicable. No additional management support is needed unless otherwise documented below in the visit note.  Chief Complaint  Patient presents with  . Follow-up    HPI: Megan Lucas 80 y.o.   Fu of  Episode of nv of undetermine etiology Since her last visit she has had no more episodes of vomiting no abdominal pain or other of those concerns. She does have continued left back bothers some pain that isn't severe she's had for a while. Friends of told her that her back isun even.  US showed some liver changes   Fatty early cirrhosis?   Doesn't  use etoh  Only ocass use nsaids no tylenol .  Check skin area on back had a removal years ago   No headaches neurological symptoms falling.  No other episodes . Since last visit .   Not pain with these episodes . ROS: See pertinent positives and negatives per HPI.  Past Medical History:  Diagnosis Date  . Cataract   . Colon polyp, hyperplastic   . Diverticulosis   . Environmental allergies   . History of arm fracture    Left  . Osteoarthritis   . Osteoporosis    DEXA 2007-3.2 spine -1.6 hip    Family History  Problem Relation Age of Onset  . Breast cancer    . Lung disease    . Stroke Father 52  . Prostate cancer Father   . Other Mother     Died MVA sister died MVA age 56  . Colon cancer Neg Hx     Social History   Social History  . Marital status: Divorced    Spouse name: N/A  . Number of children: N/A  . Years of education: N/A   Social History Main Topics  . Smoking status: Current Every Day Smoker    Packs/day: 0.50    Types: Cigarettes  . Smokeless tobacco: Never Used  . Alcohol use No  . Drug use: No  . Sexual activity: Not Asked   Other Topics Concern  . None   Social History Narrative   Childbirth x2 g2 p2    Negative alcohol tobacco times many years 8 to 10 in the evening   Does do regular exercise seat belt use.   Divorced widowed [place]   No pets    Outpatient Medications Prior to Visit  Medication Sig Dispense Refill  . aspirin EC 81 MG tablet Take 81 mg by mouth daily.    . Calcium Carbonate-Vit D-Min (CALCIUM 1200 PO) Take by mouth 2 (two) times daily.    . Ginger, Zingiber officinalis, (GINGER ROOT PO) Take by mouth.    Marland Kitchen glucosamine-chondroitin 500-400 MG tablet Take 1 tablet by mouth 2 (two) times daily.    . Multiple Vitamin (MULTIVITAMIN) tablet Take 1 tablet by mouth daily.    . Omega-3 Fatty Acids (FISH OIL PO) Take by mouth daily.    Marland Kitchen POTASSIUM CITRATE PO Take by mouth daily.    . TURMERIC PO Take by mouth.     No facility-administered medications prior to visit.      EXAM:  BP 136/82 (BP Location: Right Arm, Patient Position: Sitting, Cuff Size: Normal)   Temp 98.8 F (37.1 C) (Oral)   Ht 4\' 10"  (1.473 m)   Wt 119 lb (54 kg)   BMI 24.87 kg/m   Body mass index is 24.87 kg/m.  GENERAL: vitals reviewed and listed  above, alert, oriented, appears well hydrated and in no acute distress HEENT: atraumatic, conjunctiva  clear, no obvious abnormalities on inspection of external nose and ears NECK: no obvious masses on inspection palpation  LUNGS: clear to auscultation bilaterally, no wheezes, rales or rhonchi, good air movement CV: HRRR, no clubbing cyanosis or  peripheral edema nl cap refill  She does have some scoliosis and kyphosis but no point tenderness in the back. She is nonicteric. MS: moves all extremities without noticeable focal  Abnormality  djd changes   See mri  2-007 dr Rushie Nyhan  Skin mid back there is a 4 mm round smooth but very dark mole  Raised  PSYCH: pleasant and cooperative, no obvious depression or anxiety ua showed 1+ blood protein   Blood tests were normal   US  IMPRESSION: 1. Liver has a heterogeneous parenchymal pattern consistent fatty infiltration and/or hepatocellular disease. Liver has a slightly lobular contour consistent with cirrhosis. No focal  hepatic abnormality identified.  2. 3 mm gallbladder polyp. No gallstones. No evidence of cholecystitis. No biliary distention.   Electronically Signed   By: Marcello Moores  Register   On: 02/03/2016 10:23  Lab Results  Component Value Date   WBC 8.4 01/20/2016   HGB 13.6 01/20/2016   HCT 40.6 01/20/2016   PLT 198.0 01/20/2016   GLUCOSE 84 01/20/2016   CHOL 192 09/05/2012   TRIG 115.0 09/05/2012   HDL 53.10 09/05/2012   LDLDIRECT 135.6 06/13/2009   LDLCALC 116 (H) 09/05/2012   ALT 13 01/20/2016   AST 15 01/20/2016   NA 142 01/20/2016   K 3.6 01/20/2016   CL 105 01/20/2016   CREATININE 0.75 01/20/2016   BUN 15 01/20/2016   CO2 30 01/20/2016   TSH 0.89 10/21/2013   Wt Readings from Last 3 Encounters:  02/20/16 119 lb (54 kg)  01/20/16 119 lb 6.4 oz (54.2 kg)  12/08/13 123 lb (55.8 kg)   BP Readings from Last 3 Encounters:  02/20/16 136/82  01/20/16 114/66  12/08/13 121/74     ASSESSMENT AND PLAN:  Discussed the following assessment and plan:  Nausea and vomiting, intractability of vomiting not specified, unspecified vomiting type - unknown cause curious episode x 4 no recurrance no systemic sx  pain wieght loss etc  follow closely for now can refer Gi fi recurrs  - Plan: Urine Microscopic Only, Hepatic function panel, Ferritin, IBC panel, Protime-INR, Sedimentation rate, Hepatitis C antibody, Hep B Surface Antigen, Hep B Surface Antibody, Hepatitis B Core AB, Total  Pigmented skin lesion of uncertain nature - skin consult - Plan: Ambulatory referral to Dermatology  Left-sided thoracic back pain - prob mechnaichal with scoliosis kyphosis noted  follow - Plan: Urine Microscopic Only, Hepatic function panel, Ferritin, IBC panel, Protime-INR, Sedimentation rate, Hepatitis C antibody, Hep B Surface Antigen, Hep B Surface Antibody, Hepatitis B Core AB, Total  Abnormal urinalysis - get ua micro at [facility] with her labs had + blood on DS  - Plan: Urine Microscopic  Only  Abnormal liver ultrasound - poss fatty liver  no hx etohcaution with supplements - Plan: Urine Microscopic Only, Hepatic function panel, Ferritin, IBC panel, Protime-INR, Sedimentation rate, Hepatitis C antibody, Hep B Surface Antigen, Hep B Surface Antibody, Hepatitis B Core AB, Total  Gall bladder polyp  Liver on [place] poss fatty liver  Vs other  She has no other stigmata of cirrhosis nor high risk by lab tests her situation. However will get hepatitis screen albumin repeat ferritin and IBC etc.  Heart healthy diet and follow. -Patient advised to return or notify health care team  if symptoms worsen ,persist or new concerns arise.  Patient Instructions  Get a urinalysis and microscopic   Urine at the [facility] visit .  Get  Urine  and blood work .  I will  put in future orders for this . (getting  Screening  Hepatitis screening .)   Probably have  Fatty liver   But  blood tests show normal function Uncertain how important the  Liver findings are on  The [place] .   But will follow .  But eat healthy and  Follow    Heart healthy diet .   Will be contacted about  Skin lesion evaluation .   ROV in 3 months   OR  If vomiting recurs.     Consideration of seeing  Dr Henrene Pastor or other GI if persits.     Standley Brooking. Calhoun Reichardt M.D.

## 2016-02-20 ENCOUNTER — Encounter: Payer: Self-pay | Admitting: Internal Medicine

## 2016-02-20 ENCOUNTER — Ambulatory Visit (INDEPENDENT_AMBULATORY_CARE_PROVIDER_SITE_OTHER): Payer: Medicare Other | Admitting: Internal Medicine

## 2016-02-20 VITALS — BP 136/82 | Temp 98.8°F | Ht <= 58 in | Wt 119.0 lb

## 2016-02-20 DIAGNOSIS — R8290 Unspecified abnormal findings in urine: Secondary | ICD-10-CM

## 2016-02-20 DIAGNOSIS — R932 Abnormal findings on diagnostic imaging of liver and biliary tract: Secondary | ICD-10-CM

## 2016-02-20 DIAGNOSIS — M546 Pain in thoracic spine: Secondary | ICD-10-CM

## 2016-02-20 DIAGNOSIS — L819 Disorder of pigmentation, unspecified: Secondary | ICD-10-CM | POA: Diagnosis not present

## 2016-02-20 DIAGNOSIS — R112 Nausea with vomiting, unspecified: Secondary | ICD-10-CM

## 2016-02-20 DIAGNOSIS — K824 Cholesterolosis of gallbladder: Secondary | ICD-10-CM | POA: Insufficient documentation

## 2016-02-20 NOTE — Patient Instructions (Addendum)
Get a urinalysis and microscopic   Urine at the [facility] visit .  Get  Urine  and blood work .  I will  put in future orders for this . (getting  Screening  Hepatitis screening .)   Probably have  Fatty liver   But  blood tests show normal function Uncertain how important the  Liver findings are on  The [place] .   But will follow .  But eat healthy and  Follow    Heart healthy diet .   Will be contacted about  Skin lesion evaluation .   ROV in 3 months   OR  If vomiting recurs.     Consideration of seeing  Dr Henrene Pastor or other GI if persits.

## 2016-02-24 ENCOUNTER — Other Ambulatory Visit (INDEPENDENT_AMBULATORY_CARE_PROVIDER_SITE_OTHER): Payer: Medicare Other

## 2016-02-24 DIAGNOSIS — R112 Nausea with vomiting, unspecified: Secondary | ICD-10-CM

## 2016-02-24 DIAGNOSIS — R8290 Unspecified abnormal findings in urine: Secondary | ICD-10-CM | POA: Diagnosis not present

## 2016-02-24 DIAGNOSIS — R932 Abnormal findings on diagnostic imaging of liver and biliary tract: Secondary | ICD-10-CM | POA: Diagnosis not present

## 2016-02-24 DIAGNOSIS — M546 Pain in thoracic spine: Secondary | ICD-10-CM | POA: Diagnosis not present

## 2016-02-24 LAB — HEPATIC FUNCTION PANEL
ALBUMIN: 4 g/dL (ref 3.5–5.2)
ALK PHOS: 54 U/L (ref 39–117)
ALT: 13 U/L (ref 0–35)
AST: 16 U/L (ref 0–37)
BILIRUBIN DIRECT: 0 mg/dL (ref 0.0–0.3)
BILIRUBIN TOTAL: 0.4 mg/dL (ref 0.2–1.2)
Total Protein: 6.5 g/dL (ref 6.0–8.3)

## 2016-02-24 LAB — URINALYSIS, MICROSCOPIC ONLY

## 2016-02-24 LAB — SEDIMENTATION RATE: SED RATE: 9 mm/h (ref 0–30)

## 2016-02-24 LAB — FERRITIN: Ferritin: 45.8 ng/mL (ref 10.0–291.0)

## 2016-02-24 LAB — IBC PANEL
Iron: 101 ug/dL (ref 42–145)
SATURATION RATIOS: 28.6 % (ref 20.0–50.0)
TRANSFERRIN: 252 mg/dL (ref 212.0–360.0)

## 2016-02-24 LAB — PROTIME-INR
INR: 1 ratio (ref 0.8–1.0)
Prothrombin Time: 10.8 s (ref 9.6–13.1)

## 2016-02-25 LAB — HEPATITIS B SURFACE ANTIGEN: Hepatitis B Surface Ag: NEGATIVE

## 2016-02-25 LAB — HEPATITIS C ANTIBODY: HCV Ab: NEGATIVE

## 2016-02-25 LAB — HEPATITIS B SURFACE ANTIBODY,QUALITATIVE: Hep B S Ab: NEGATIVE

## 2016-02-25 LAB — HEPATITIS B CORE ANTIBODY, TOTAL: HEP B C TOTAL AB: NONREACTIVE

## 2016-02-28 ENCOUNTER — Other Ambulatory Visit: Payer: Self-pay | Admitting: Family Medicine

## 2016-02-28 DIAGNOSIS — R8289 Other abnormal findings on cytological and histological examination of urine: Secondary | ICD-10-CM

## 2016-03-27 ENCOUNTER — Telehealth: Payer: Self-pay | Admitting: Internal Medicine

## 2016-03-27 NOTE — Telephone Encounter (Signed)
error 

## 2016-03-29 ENCOUNTER — Telehealth: Payer: Self-pay | Admitting: Internal Medicine

## 2016-03-29 NOTE — Telephone Encounter (Signed)
Pt states she cannot see [facility] until Dec 7.  Pt states she is having continued back ache around mid back, frequent urination. Pt concerned she may not need to wait this long if current UTI still going on.  Please advise.

## 2016-03-30 NOTE — Telephone Encounter (Signed)
Spoke with [facility]. She states that they do not have any new patient openings until December. She will add pt to cancellation list and contact pt if they have an opening.   Spoke with pt and advised of above. Pt also states that she does have left-sided back pain and urinary frequency. Pt denies pain/burning/incontinence or f/n/v. Pt is concerned that she has a UTI and wants to know if she needs to be on abx therapy.   Spoke with Dr. Regis Bill and she would like to order a repeat UA with micro and possible culture if needed. She will use those results to determine if pt needs abx.   Spoke with pt and was going to advise, however she states that [facility] had called her and offered her appt for Monday morning for consultation. I let Misty know and she will advise Dr. Regis Bill.   Nothing further needed.

## 2016-04-02 DIAGNOSIS — R3121 Asymptomatic microscopic hematuria: Secondary | ICD-10-CM | POA: Diagnosis not present

## 2016-04-02 DIAGNOSIS — R319 Hematuria, unspecified: Secondary | ICD-10-CM | POA: Diagnosis not present

## 2016-04-16 DIAGNOSIS — R3129 Other microscopic hematuria: Secondary | ICD-10-CM | POA: Diagnosis not present

## 2016-04-16 DIAGNOSIS — R3121 Asymptomatic microscopic hematuria: Secondary | ICD-10-CM | POA: Diagnosis not present

## 2016-04-23 DIAGNOSIS — N202 Calculus of kidney with calculus of ureter: Secondary | ICD-10-CM | POA: Diagnosis not present

## 2016-04-23 DIAGNOSIS — R3121 Asymptomatic microscopic hematuria: Secondary | ICD-10-CM | POA: Diagnosis not present

## 2016-04-24 DIAGNOSIS — L821 Other seborrheic keratosis: Secondary | ICD-10-CM | POA: Diagnosis not present

## 2016-04-24 DIAGNOSIS — L718 Other rosacea: Secondary | ICD-10-CM | POA: Diagnosis not present

## 2016-06-01 ENCOUNTER — Ambulatory Visit: Payer: Medicare Other | Admitting: Internal Medicine

## 2016-06-15 DIAGNOSIS — R3121 Asymptomatic microscopic hematuria: Secondary | ICD-10-CM | POA: Diagnosis not present

## 2016-07-11 DIAGNOSIS — R3121 Asymptomatic microscopic hematuria: Secondary | ICD-10-CM | POA: Diagnosis not present

## 2016-07-11 DIAGNOSIS — N202 Calculus of kidney with calculus of ureter: Secondary | ICD-10-CM | POA: Diagnosis not present

## 2016-07-25 DIAGNOSIS — R1111 Vomiting without nausea: Secondary | ICD-10-CM | POA: Diagnosis not present

## 2016-07-25 DIAGNOSIS — N200 Calculus of kidney: Secondary | ICD-10-CM | POA: Diagnosis not present

## 2016-07-25 DIAGNOSIS — R3121 Asymptomatic microscopic hematuria: Secondary | ICD-10-CM | POA: Diagnosis not present

## 2016-08-07 ENCOUNTER — Other Ambulatory Visit: Payer: Self-pay | Admitting: Urology

## 2016-08-07 DIAGNOSIS — N200 Calculus of kidney: Secondary | ICD-10-CM | POA: Diagnosis not present

## 2016-08-09 ENCOUNTER — Encounter (HOSPITAL_COMMUNITY): Payer: Self-pay | Admitting: *Deleted

## 2016-08-10 ENCOUNTER — Encounter (HOSPITAL_COMMUNITY): Payer: Self-pay

## 2016-08-12 NOTE — H&P (Signed)
CC: I have kidney stones.  HPI: Megan Lucas is a 81 year-old female established patient who is here for renal calculi.  The problem is on the left side. She first stated noticing pain on approximately 04/04/2016. This is her first kidney stone. She is currently having flank pain, back pain, and groin pain. She denies having nausea, vomiting, fever, and chills. She has not caught a stone in her urine strainer since her symptoms began.   She has never had surgical treatment for calculi in the past.   08/07/2016: She was found to have a 1.3cm L UPJ calculus on workup for microhematuria. She has dull intermittent, mild left flank pain radiating to the left groin.      ALLERGIES: No Known Drug Allergies    MEDICATIONS: Aspirin 81 mg tablet, chewable  Calcium  Fish Oil  Ginger Root  Glucosamine & Chondroitin  Hydrocodone-Acetaminophen 5 mg-325 mg tablet 1 tablet PO Q 8 H  Multivitamin  Turmeric     GU PSH: Locm 300-399Mg /Ml Iodine,1Ml - 04/16/2016    NON-GU PSH: None   GU PMH: Calculus Kidney and Ureter - 04/23/2016 Asymptomatic microscopic hematuria - 04/02/2016 Nocturia - 04/02/2016    NON-GU PMH: Osteoarthritis    FAMILY HISTORY: Death - Mother, Father Kidney Stones - Father Prostate Cancer - Father    Notes: mother passed @ age 42-car accident  father passed @ age 64-undiagnosed infection possibly shingles related   SOCIAL HISTORY: Marital Status: Divorced Current Smoking Status: Patient smokes. Smokes 1/2 pack per day.  Has never drank.  Drinks 3 caffeinated drinks per day. Patient's occupation is/was retired.    REVIEW OF SYSTEMS:    GU Review Female:   Patient denies frequent urination, hard to postpone urination, burning /pain with urination, get up at night to urinate, leakage of urine, stream starts and stops, trouble starting your stream, have to strain to urinate, and currently pregnant.  Gastrointestinal (Upper):   Patient reports vomiting. Patient denies  nausea and indigestion/ heartburn.  Gastrointestinal (Lower):   Patient denies diarrhea and constipation.  Constitutional:   Patient denies fever, night sweats, weight loss, and fatigue.  Skin:   Patient denies skin rash/ lesion and itching.  Eyes:   Patient reports blurred vision. Patient denies double vision.  Ears/ Nose/ Throat:   Patient denies sore throat and sinus problems.  Hematologic/Lymphatic:   Patient denies easy bruising and swollen glands.  Cardiovascular:   Patient denies leg swelling and chest pains.  Respiratory:   Patient denies cough and shortness of breath.  Endocrine:   Patient denies excessive thirst.  Musculoskeletal:   Patient reports back pain. Patient denies joint pain.  Neurological:   Patient denies headaches and dizziness.  Psychologic:   Patient denies depression and anxiety.   VITAL SIGNS:      08/07/2016 03:33 PM  Weight 120 lb / 54.43 kg  Height 60 in / 152.4 cm  BP 118/75 mmHg  Pulse 89 /min  Temperature 99.3 F / 37 C  BMI 23.4 kg/m   MULTI-SYSTEM PHYSICAL EXAMINATION:    Constitutional: Well-nourished. No physical deformities. Normally developed. Good grooming.  Neck: Neck symmetrical, not swollen. Normal tracheal position.  Respiratory: No labored breathing, no use of accessory muscles.   Cardiovascular: Normal temperature, normal extremity pulses, no swelling, no varicosities.  Lymphatic: No enlargement of neck, axillae, groin.  Skin: No paleness, no jaundice, no cyanosis. No lesion, no ulcer, no rash.  Gastrointestinal: No mass, no tenderness, no rigidity, non obese abdomen.  Ears, Nose, Mouth, and Throat: Left ear no scars, no lesions, no masses. Right ear no scars, no lesions, no masses. Nose no scars, no lesions, no masses. Normal hearing. Normal lips.  Musculoskeletal: Normal gait and station of head and neck.     PAST DATA REVIEWED:  Source Of History:  Patient   PROCEDURES:          Urinalysis w/Scope Dipstick Dipstick Cont'd Micro   Color: Yellow Bilirubin: Neg WBC/hpf: 6 - 10/hpf  Appearance: Cloudy Ketones: Neg RBC/hpf: 10 - 20/hpf  Specific Gravity: 1.025 Blood: 3+ Bacteria: Rare (0-9/hpf)  pH: 6.0 Protein: 2+ Cystals: NS (Not Seen)  Glucose: Neg Urobilinogen: 0.2 Casts: NS (Not Seen)    Nitrites: Neg Trichomonas: Not Present    Leukocyte Esterase: Neg Mucous: Present      Epithelial Cells: 0 - 5/hpf      Yeast: NS (Not Seen)      Sperm: Not Present    ASSESSMENT:      ICD-10 Details  1 GU:   Kidney Stone - N20.0    PLAN:           Document Letter(s):  Created for Patient: Clinical Summary         Notes:   left UPj calculus:  -We discussed ESWL, URS, and PCNL and the patient elects for ESWL.    Her urine culture was negative.

## 2016-08-13 ENCOUNTER — Ambulatory Visit (HOSPITAL_COMMUNITY): Payer: Medicare Other

## 2016-08-13 ENCOUNTER — Encounter (HOSPITAL_COMMUNITY)
Admission: RE | Disposition: A | Discharge status: Home / Self Care | Payer: Self-pay | Source: Ambulatory Visit | Attending: Urology

## 2016-08-13 ENCOUNTER — Ambulatory Visit (HOSPITAL_COMMUNITY)
Admission: RE | Admit: 2016-08-13 | Discharge: 2016-08-13 | Disposition: A | Discharge status: Home / Self Care | Payer: Medicare Other | Source: Ambulatory Visit | Attending: Urology | Admitting: Urology

## 2016-08-13 ENCOUNTER — Encounter (HOSPITAL_COMMUNITY): Payer: Self-pay | Admitting: *Deleted

## 2016-08-13 DIAGNOSIS — Z79899 Other long term (current) drug therapy: Secondary | ICD-10-CM | POA: Insufficient documentation

## 2016-08-13 DIAGNOSIS — N201 Calculus of ureter: Secondary | ICD-10-CM | POA: Diagnosis not present

## 2016-08-13 DIAGNOSIS — F17210 Nicotine dependence, cigarettes, uncomplicated: Secondary | ICD-10-CM | POA: Diagnosis not present

## 2016-08-13 DIAGNOSIS — N200 Calculus of kidney: Secondary | ICD-10-CM | POA: Diagnosis not present

## 2016-08-13 DIAGNOSIS — Z7982 Long term (current) use of aspirin: Secondary | ICD-10-CM | POA: Diagnosis not present

## 2016-08-13 DIAGNOSIS — Z79891 Long term (current) use of opiate analgesic: Secondary | ICD-10-CM | POA: Insufficient documentation

## 2016-08-13 HISTORY — DX: Chronic kidney disease, unspecified: N18.9

## 2016-08-13 HISTORY — PX: EXTRACORPOREAL SHOCK WAVE LITHOTRIPSY: SHX1557

## 2016-08-13 SURGERY — LITHOTRIPSY, ESWL
Anesthesia: LOCAL | Laterality: Left

## 2016-08-13 MED ORDER — SODIUM CHLORIDE 0.9% FLUSH: 3.0000 mL | INTRAVENOUS | Status: DC | PRN

## 2016-08-13 MED ORDER — ACETAMINOPHEN 325 MG PO TABS: 650.0000 mg | ORAL_TABLET | ORAL | Status: DC | PRN

## 2016-08-13 MED ORDER — ACETAMINOPHEN 650 MG RE SUPP: 650.0000 mg | RECTAL | Status: DC | PRN

## 2016-08-13 MED ORDER — DIPHENHYDRAMINE HCL 25 MG PO CAPS
25.0000 mg | ORAL_CAPSULE | ORAL | Status: AC
  Administered 2016-08-13: 25 mg via ORAL
  Filled 2016-08-13: qty 1

## 2016-08-13 MED ORDER — FENTANYL CITRATE (PF) 100 MCG/2ML IJ SOLN: 25.0000 ug | INTRAMUSCULAR | Status: DC | PRN

## 2016-08-13 MED ORDER — SODIUM CHLORIDE 0.9% FLUSH: 3.0000 mL | Freq: Two times a day (BID) | INTRAVENOUS | Status: DC

## 2016-08-13 MED ORDER — SODIUM CHLORIDE 0.9 % IV SOLN
INTRAVENOUS | Status: DC
  Administered 2016-08-13: 10:00:00 via INTRAVENOUS

## 2016-08-13 MED ORDER — CIPROFLOXACIN HCL 500 MG PO TABS
500.0000 mg | ORAL_TABLET | ORAL | Status: AC
  Administered 2016-08-13: 500 mg via ORAL
  Filled 2016-08-13: qty 1

## 2016-08-13 MED ORDER — OXYCODONE HCL 5 MG PO TABS: 5.0000 mg | ORAL_TABLET | ORAL | Status: DC | PRN

## 2016-08-13 MED ORDER — DIAZEPAM 5 MG PO TABS
10.0000 mg | ORAL_TABLET | ORAL | Status: AC
  Administered 2016-08-13: 10 mg via ORAL
  Filled 2016-08-13: qty 2

## 2016-08-13 MED ORDER — SODIUM CHLORIDE 0.9 % IV SOLN: 250.0000 mL | INTRAVENOUS | Status: DC | PRN

## 2016-08-13 NOTE — Interval H&P Note (Signed)
History and Physical Interval Note:  No change in stone position.  08/13/2016 12:02 PM  Megan Lucas  has presented today for surgery, with the diagnosis of LEFT URETEROPELVIC JUNCTION CALCULUS  The various methods of treatment have been discussed with the patient and family. After consideration of risks, benefits and other options for treatment, the patient has consented to  Procedure(s): LEFT EXTRACORPOREAL SHOCK WAVE LITHOTRIPSY (ESWL) (Left) as a surgical intervention .  The patient's history has been reviewed, patient examined, no change in status, stable for surgery.  I have reviewed the patient's chart and labs.  Questions were answered to the patient's satisfaction.     Darry Kelnhofer J

## 2016-08-13 NOTE — Discharge Instructions (Addendum)
Lithotripsy, Care After This sheet gives you information about how to care for yourself after your procedure. Your health care provider may also give you more specific instructions. If you have problems or questions, contact your health care provider. What can I expect after the procedure? After the procedure, it is common to have:  Some blood in your urine. This should only last for a few days.  Soreness in your back, sides, or upper abdomen for a few days.  Blotches or bruises on your back where the pressure wave entered the skin.  Pain, discomfort, or nausea when pieces (fragments) of the kidney stone move through the tube that carries urine from the kidney to the bladder (ureter). Stone fragments may pass soon after the procedure, but they may continue to pass for up to 4-8 weeks.  If you have severe pain or nausea, contact your health care provider. This may be caused by a large stone that was not broken up, and this may mean that you need more treatment.  Some pain or discomfort during urination.  Some pain or discomfort in the lower abdomen or (in men) at the base of the penis. Follow these instructions at home: Medicines   Take over-the-counter and prescription medicines only as told by your health care provider.  If you were prescribed an antibiotic medicine, take it as told by your health care provider. Do not stop taking the antibiotic even if you start to feel better.  Do not drive for 24 hours if you were given a medicine to help you relax (sedative).  Do not drive or use heavy machinery while taking prescription pain medicine. Eating and drinking   Drink enough water and fluids to keep your urine clear or pale yellow. This helps any remaining pieces of the stone to pass. It can also help prevent new stones from forming.  Eat plenty of fresh fruits and vegetables.  Follow instructions from your health care provider about eating and drinking restrictions. You may be  instructed:  To reduce how much salt (sodium) you eat or drink. Check ingredients and nutrition facts on packaged foods and beverages.  To reduce how much meat you eat.  Eat the recommended amount of calcium for your age and gender. Ask your health care provider how much calcium you should have. General instructions   Get plenty of rest.  Most people can resume normal activities 1-2 days after the procedure. Ask your health care provider what activities are safe for you.  If directed, strain all urine through the strainer that was provided by your health care provider.  Keep all fragments for your health care provider to see. Any stones that are found may be sent to a medical lab for examination. The stone may be as small as a grain of salt.  Keep all follow-up visits as told by your health care provider. This is important. Contact a health care provider if:  You have pain that is severe or does not get better with medicine.  You have nausea that is severe or does not go away.  You have blood in your urine longer than your health care provider told you to expect.  You have more blood in your urine.  You have pain during urination that does not go away.  You urinate more frequently than usual and this does not go away.  You develop a rash or any other possible signs of an allergic reaction. Get help right away if:  You have severe  pain in your back, sides, or upper abdomen.  You have severe pain while urinating.  Your urine is very dark red.  You have blood in your stool (feces).  You cannot pass any urine at all.  You feel a strong urge to urinate after emptying your bladder.  You have a fever or chills.  You develop shortness of breath, difficulty breathing, or chest pain.  You have severe nausea that leads to persistent vomiting.  You faint. Summary  After this procedure, it is common to have some pain, discomfort, or nausea when pieces (fragments) of the  kidney stone move through the tube that carries urine from the kidney to the bladder (ureter). If this pain or nausea is severe, however, you should contact your health care provider.  Most people can resume normal activities 1-2 days after the procedure. Ask your health care provider what activities are safe for you.  Drink enough water and fluids to keep your urine clear or pale yellow. This helps any remaining pieces of the stone to pass, and it can help prevent new stones from forming.  If directed, strain your urine and keep all fragments for your health care provider to see. Fragments or stones may be as small as a grain of salt.  Get help right away if you have severe pain in your back, sides, or upper abdomen or have severe pain while urinating. This information is not intended to replace advice given to you by your health care provider. Make sure you discuss any questions you have with your health care provider. Document Released: 06/10/2007 Document Revised: 04/11/2016 Document Reviewed: 04/11/2016 Elsevier Interactive Patient Education  2017 [employer].   Moderate Conscious Sedation, Adult, Care After These instructions provide you with information about caring for yourself after your procedure. Your health care provider may also give you more specific instructions. Your treatment has been planned according to current medical practices, but problems sometimes occur. Call your health care provider if you have any problems or questions after your procedure. What can I expect after the procedure? After your procedure, it is common:  To feel sleepy for several hours.  To feel clumsy and have poor balance for several hours.  To have poor judgment for several hours.  To vomit if you eat too soon. Follow these instructions at home: For at least 24 hours after the procedure:    Do not:  Participate in activities where you could fall or become injured.  Drive.  Use heavy  machinery.  Drink alcohol.  Take sleeping pills or medicines that cause drowsiness.  Make important decisions or sign legal documents.  Take care of children on your own.  Rest. Eating and drinking   Follow the diet recommended by your health care provider.  If you vomit:  Drink water, juice, or soup when you can drink without vomiting.  Make sure you have little or no nausea before eating solid foods. General instructions   Have a responsible adult stay with you until you are awake and alert.  Take over-the-counter and prescription medicines only as told by your health care provider.  If you smoke, do not smoke without supervision.  Keep all follow-up visits as told by your health care provider. This is important. Contact a health care provider if:  You keep feeling nauseous or you keep vomiting.  You feel light-headed.  You develop a rash.  You have a fever. Get help right away if:  You have trouble breathing. This information  is not intended to replace advice given to you by your health care provider. Make sure you discuss any questions you have with your health care provider. Document Released: 03/11/2013 Document Revised: 10/24/2015 Document Reviewed: 09/10/2015 Elsevier Interactive Patient Education  2017 [employer].

## 2016-08-23 ENCOUNTER — Encounter (HOSPITAL_COMMUNITY): Payer: Self-pay | Admitting: Urology

## 2016-08-27 DIAGNOSIS — N200 Calculus of kidney: Secondary | ICD-10-CM | POA: Diagnosis not present

## 2016-10-01 DIAGNOSIS — N200 Calculus of kidney: Secondary | ICD-10-CM | POA: Diagnosis not present

## 2016-12-31 DIAGNOSIS — Z1231 Encounter for screening mammogram for malignant neoplasm of breast: Secondary | ICD-10-CM | POA: Diagnosis not present

## 2016-12-31 LAB — HM MAMMOGRAPHY

## 2017-01-01 ENCOUNTER — Encounter: Payer: Self-pay | Admitting: Internal Medicine

## 2017-01-03 DIAGNOSIS — N200 Calculus of kidney: Secondary | ICD-10-CM | POA: Diagnosis not present

## 2017-04-01 NOTE — Progress Notes (Signed)
Chief Complaint  Patient presents with  . Yearly Visit    Irritated eyes - using gel eye drops.     HPI: Megan Lucas 81 y.o. comes in today for Preventive Medicare exam/ wellness visit . Has a listof issues  Renal stone .   Resolved had lithotrypsy and left flank pain gone Other issues    Eye lid irritation no fever went to opt  Cause didn't want to wait  In opthal office    No vision change  Using otc s  No fall no fracture  Says still active .   Tobacco  Doesn't inhale much denies  Sob  Cough   Takes asa baby  ? Why  Legs had a fe bruises  No bleeding though  Health Maintenance  Topic Date Due  . DEXA SCAN  07/28/2002  . TETANUS/TDAP  09/26/2014  . INFLUENZA VACCINE  08/13/2018 (Originally 01/02/2017)  . MAMMOGRAM  12/31/2017  . PNA vac Low Risk Adult  Completed   Health Maintenance Review LIFESTYLE:  Exercise:   Plays tennis  Dose exericse  Tobacco/ETS: "dosen inhale " Alcohol: n Sugar beverages:n Sleep: Drug use: no HH:1 social    Hearing: to get aids     Vision:  No limitations at present . Last eye check UTD  Safety:  Has smoke detector and wears seat belts.  No firearms. No excess sun exposure. Sees dentist regularly.  Falls:   Memory: Felt to be good  , by her.  Depression: No anhedonia unusual crying or depressive symptoms  Nutrition: Eats well balanced diet; adequate calcium and vitamin D. No swallowing chewing problems.  Injury: no major injuries in the last six months.  Other healthcare providers:  Reviewed today .  Marland Kitchen   Preventive parameters: up-to-date  Reviewed  Declined flu vaccine  asksa bout shingles vaccine  Concern about costs of any vaccine or  Procedures  /  ADLS:   There are no problems or need for assistance  driving, feeding, obtaining food, dressing, toileting and bathing, managing money using phone. She is independent.   ROS:  left buttock no radiating pain when lays down  Fine in day  Sleeps on lef tside some better if  changes no radiation  GEN/ HEENT: No fever, significant weight changes sweats headaches vision problems CV/ PULM; No chest pain shortness of breath cough, syncope,edema  change in exercise tolerance. GI /GU: No adominal pain, vomiting, change in bowel habits. No blood in the stool. No significant GU symptoms. SKIN/HEME: ,no acute skin rashes suspicious lesions or bleeding. No lymphadenopathy, nodules, masses.  NEURO/ PSYCH:  No neurologic signs such as weakness numbness. No depression anxiety. IMM/ Allergy: No unusual infections.  Allergy .   REST of 12 system review negative except as per HPI   Past Medical History:  Diagnosis Date  . Cataract   . Chronic kidney disease    kidney stone left 2018  . Colon polyp, hyperplastic   . Cough, persistent 09/05/2012   Suspect COPD symptoms rule out infection other.   . Diverticulosis   . Environmental allergies   . History of arm fracture    Left  . Osteoarthritis   . Osteoporosis    DEXA 2007-3.2 spine -1.6 hip    Family History  Problem Relation Age of Onset  . Breast cancer Unknown   . Lung disease Unknown   . Stroke Father 32  . Prostate cancer Father   . Other Mother  Died MVA sister died MVA age 27  . Colon cancer Neg Hx     Social History   Social History  . Marital status: Divorced    Spouse name: N/A  . Number of children: N/A  . Years of education: N/A   Social History Main Topics  . Smoking status: Current Every Day Smoker    Packs/day: 0.50    Types: Cigarettes  . Smokeless tobacco: Never Used     Comment: smokes only in evening - states she does not inhale (04/02/17)  . Alcohol use No  . Drug use: No  . Sexual activity: Not Asked   Other Topics Concern  . None   Social History Narrative   Childbirth x2 g2 p2    Negative alcohol tobacco times many years 8 to 10 in the evening   Does do regular exercise seat belt use.   Divorced widowed Public house manager   No pets    Outpatient  Encounter Prescriptions as of 04/02/2017  Medication Sig  . aspirin EC 81 MG tablet Take 81 mg by mouth daily.  . Calcium Carbonate-Vit D-Min (CALCIUM 1200 PO) Take by mouth 2 (two) times daily.  . Ginger, Zingiber officinalis, (GINGER ROOT PO) Take by mouth.  Marland Kitchen glucosamine-chondroitin 500-400 MG tablet Take 1 tablet by mouth 2 (two) times daily.  Marland Kitchen HYDROcodone-acetaminophen (NORCO/VICODIN) 5-325 MG tablet Take 1 tablet by mouth every 6 (six) hours as needed for moderate pain.  Marland Kitchen LUTEIN PO Take 1 tablet by mouth daily.  . Multiple Vitamin (MULTIVITAMIN) tablet Take 1 tablet by mouth daily.  . Omega-3 Fatty Acids (FISH OIL PO) Take by mouth daily.  Marland Kitchen POTASSIUM CITRATE PO Take by mouth daily.  . TURMERIC PO Take by mouth.   No facility-administered encounter medications on file as of 04/02/2017.     EXAM:  BP 100/72 (BP Location: Right Arm, Patient Position: Sitting, Cuff Size: Normal)   Pulse 76   Temp 98.5 F (36.9 C) (Oral)   Ht '4\' 10"'$  (1.473 m)   Wt 111 lb 6.4 oz (50.5 kg)   BMI 23.28 kg/m   Body mass index is 23.28 kg/m.  Physical Exam: Vital signs reviewed [id number] is a well-developed well-nourished alert cooperative   who appears stated age in no acute distress.  Hard of hearing  HEENT: normocephalic atraumatic , Eyes: PERRL EOM's full, conjunctiva clear,  Eye lids mild erythema  Nl dc no edema  Nares: paten,t no deformity discharge or tenderness., Ears: no deformity EAC's clear TMs with normal landmarks. Mouth: clear OP, no lesions, edema.  Moist mucous membranes. Dentition in adequate repair. NECK: supple without masses, thyromegaly or bruits. CHEST/PULM:  Clear to auscultation and percussion breath sounds equal no wheeze , rales or rhonchi. Breast: normal by inspection . No dimpling, discharge, masses, tenderness or discharge . Marland Kitchen Some kyphosis   CV: PMI is nondisplaced, S1 S2 no gallops, murmurs, rubs. Peripheral pulses are present .No JVD .  ABDOMEN: Bowel sounds normal  nontender  No guard or rebound, no hepato splenomegal no CVA tenderness.   Extremtities:  No clubbing cyanosis or edema, no acute joint swelling or redness no focal atrophy NEURO:  Oriented x3, cranial nerves 3-12 appear to be intact, no obvious focal weakness,gait within normal limits no abnormal reflexes or asymmetrical SKIN: No acute rashes normal turgor, color, le senile ecchymosis    Dark 3 mm area back ( Seen by derm ) poss sk PSYCH: Oriented, good eye contact, no obvious depression  anxiety, cognition and judgment appear normal. LN: no cervical axillaryadenopathy No noted deficits in memory, attention, and speech. Hard of hearing does delay the discussion some    Lab Results  Component Value Date   WBC 8.9 04/02/2017   HGB 14.1 04/02/2017   HCT 42.8 04/02/2017   PLT 185.0 04/02/2017   GLUCOSE 99 04/02/2017   CHOL 197 04/02/2017   TRIG 106.0 04/02/2017   HDL 58.60 04/02/2017   LDLDIRECT 135.6 06/13/2009   LDLCALC 118 (H) 04/02/2017   ALT 11 04/02/2017   AST 14 04/02/2017   NA 143 04/02/2017   K 4.0 04/02/2017   CL 107 04/02/2017   CREATININE 0.65 04/02/2017   BUN 14 04/02/2017   CO2 29 04/02/2017   TSH 0.89 10/21/2013   INR 1.0 02/24/2016    ASSESSMENT AND PLAN:  Discussed the following assessment and plan:  Visit for preventive health examination - Plan: Basic metabolic panel, CBC with Differential/Platelet, Hepatic function panel, Lipid panel  Decreased hearing, unspecified laterality - Plan: Basic metabolic panel, CBC with Differential/Platelet, Hepatic function panel, Lipid panel  TOBACCO USER - Plan: Basic metabolic panel, CBC with Differential/Platelet, Hepatic function panel, Lipid panel, DG Bone Density  Personal history of kidney stones - Plan: Basic metabolic panel, CBC with Differential/Platelet, Hepatic function panel, Lipid panel  Irritation of eyelid - Plan: Basic metabolic panel, CBC with Differential/Platelet, Hepatic function panel, Lipid  panel  Estrogen deficiency - Plan: DG Bone Density  Medication management - Plan: Basic metabolic panel, CBC with Differential/Platelet, Hepatic function panel, Lipid panel  Influenza vaccination declined  Left buttock pain - positional  at night no other alarm features  follow  Pt reluctant to take meds  And   Disc flu vaccine    Ok to dc asa  Will advise  After lab back I suspect she has osteoporosis and some kyphosis which is not severe at this point in the past she was not interested in other intervention. Would like her to get an updated bone density and then go from there. She is aware of advisability of flu vaccine and avoiding tobacco products.    Patient Care Team: Burnis Medin, MD as PCP - General  Patient Instructions  You should see your eye doctor about your eyelid area this could be blepharitis or a very mild eyelid infection.  Blood work today screening for cholesterol and blood sugar and kidney function. I do not see compelling reason for you to continue the aspirin if you have risk of bleeding and bruising but you can decide this on your own. Your side buttocks pain may be positional musculoskeletal try sleeping on the other side sometimes bursitis will do this follow-up with orthopedics if persistent and progressive. Flu vaccine is a high value low cost vaccine you can get this at any time pharmacy elsewhere. Advise being tobacco free. Advise getting a bone density and consider intervention if you have osteoporosis to avoid future fracture. Continue weightbearing activity and exercise as you are doing. Glad your kidney stones are better.  I will review your record for any loose ends  .      Preventive Care 101 Years and Older, Female Preventive care refers to lifestyle choices and visits with your health care provider that can promote health and wellness. What does preventive care include?  A yearly physical exam. This is also called an annual well  check.  Dental exams once or twice a year.  Routine eye exams. Ask your health care provider  how often you should have your eyes checked.  Personal lifestyle choices, including: ? Daily care of your teeth and gums. ? Regular physical activity. ? Eating a healthy diet. ? Avoiding tobacco and drug use. ? Limiting alcohol use. ? Practicing safe sex. ? Taking low-dose aspirin every day. ? Taking vitamin and mineral supplements as recommended by your health care provider. What happens during an annual well check? The services and screenings done by your health care provider during your annual well check will depend on your age, overall health, lifestyle risk factors, and family history of disease. Counseling Your health care provider may ask you questions about your:  Alcohol use.  Tobacco use.  Drug use.  Emotional well-being.  Home and relationship well-being.  Sexual activity.  Eating habits.  History of falls.  Memory and ability to understand (cognition).  Work and work Astronomer.  Reproductive health.  Screening You may have the following tests or measurements:  Height, weight, and BMI.  Blood pressure.  Lipid and cholesterol levels. These may be checked every 5 years, or more frequently if you are over 63 years old.  Skin check.  Lung cancer screening. You may have this screening every year starting at age 39 if you have a 30-pack-year history of smoking and currently smoke or have quit within the past 15 years.  Fecal occult blood test (FOBT) of the stool. You may have this test every year starting at age 19.  Flexible sigmoidoscopy or colonoscopy. You may have a sigmoidoscopy every 5 years or a colonoscopy every 10 years starting at age 11.  Hepatitis C blood test.  Hepatitis B blood test.  Sexually transmitted disease (STD) testing.  Diabetes screening. This is done by checking your blood sugar (glucose) after you have not eaten for a while  (fasting). You may have this done every 1-3 years.  Bone density scan. This is done to screen for osteoporosis. You may have this done starting at age 48.  Mammogram. This may be done every 1-2 years. Talk to your health care provider about how often you should have regular mammograms.  Talk with your health care provider about your test results, treatment options, and if necessary, the need for more tests. Vaccines Your health care provider may recommend certain vaccines, such as:  Influenza vaccine. This is recommended every year.  Tetanus, diphtheria, and acellular pertussis (Tdap, Td) vaccine. You may need a Td booster every 10 years.  Varicella vaccine. You may need this if you have not been vaccinated.  Zoster vaccine. You may need this after age 57.  Measles, mumps, and rubella (MMR) vaccine. You may need at least one dose of MMR if you were born in 1957 or later. You may also need a second dose.  Pneumococcal 13-valent conjugate (PCV13) vaccine. One dose is recommended after age 24.  Pneumococcal polysaccharide (PPSV23) vaccine. One dose is recommended after age 45.  Meningococcal vaccine. You may need this if you have certain conditions.  Hepatitis A vaccine. You may need this if you have certain conditions or if you travel or work in places where you may be exposed to hepatitis A.  Hepatitis B vaccine. You may need this if you have certain conditions or if you travel or work in places where you may be exposed to hepatitis B.  Haemophilus influenzae type b (Hib) vaccine. You may need this if you have certain conditions.  Talk to your health care provider about which screenings and vaccines you need and  how often you need them. This information is not intended to replace advice given to you by your health care provider. Make sure you discuss any questions you have with your health care provider. Document Released: 06/17/2015 Document Revised: 02/08/2016 Document Reviewed:  03/22/2015 Elsevier Interactive Patient Education  2017 [place] K. Kiyomi Pallo M.D.

## 2017-04-02 ENCOUNTER — Ambulatory Visit (INDEPENDENT_AMBULATORY_CARE_PROVIDER_SITE_OTHER): Payer: Medicare Other | Admitting: Internal Medicine

## 2017-04-02 ENCOUNTER — Encounter: Payer: Self-pay | Admitting: Internal Medicine

## 2017-04-02 VITALS — BP 100/72 | HR 76 | Temp 98.5°F | Ht <= 58 in | Wt 111.4 lb

## 2017-04-02 DIAGNOSIS — Z87442 Personal history of urinary calculi: Secondary | ICD-10-CM

## 2017-04-02 DIAGNOSIS — Z79899 Other long term (current) drug therapy: Secondary | ICD-10-CM

## 2017-04-02 DIAGNOSIS — F17200 Nicotine dependence, unspecified, uncomplicated: Secondary | ICD-10-CM | POA: Diagnosis not present

## 2017-04-02 DIAGNOSIS — H9190 Unspecified hearing loss, unspecified ear: Secondary | ICD-10-CM

## 2017-04-02 DIAGNOSIS — M7918 Myalgia, other site: Secondary | ICD-10-CM | POA: Diagnosis not present

## 2017-04-02 DIAGNOSIS — Z0000 Encounter for general adult medical examination without abnormal findings: Secondary | ICD-10-CM | POA: Diagnosis not present

## 2017-04-02 DIAGNOSIS — Z2821 Immunization not carried out because of patient refusal: Secondary | ICD-10-CM | POA: Diagnosis not present

## 2017-04-02 DIAGNOSIS — H0289 Other specified disorders of eyelid: Secondary | ICD-10-CM | POA: Diagnosis not present

## 2017-04-02 DIAGNOSIS — E2839 Other primary ovarian failure: Secondary | ICD-10-CM | POA: Diagnosis not present

## 2017-04-02 LAB — HEPATIC FUNCTION PANEL
ALK PHOS: 62 U/L (ref 39–117)
ALT: 11 U/L (ref 0–35)
AST: 14 U/L (ref 0–37)
Albumin: 4.1 g/dL (ref 3.5–5.2)
BILIRUBIN DIRECT: 0.1 mg/dL (ref 0.0–0.3)
TOTAL PROTEIN: 6.1 g/dL (ref 6.0–8.3)
Total Bilirubin: 0.5 mg/dL (ref 0.2–1.2)

## 2017-04-02 LAB — LIPID PANEL
CHOL/HDL RATIO: 3
Cholesterol: 197 mg/dL (ref 0–200)
HDL: 58.6 mg/dL (ref >39.00)
LDL Cholesterol: 118 mg/dL — ABNORMAL HIGH (ref 0–99)
NONHDL: 138.81
Triglycerides: 106 mg/dL (ref 0.0–149.0)
VLDL: 21.2 mg/dL (ref 0.0–40.0)

## 2017-04-02 LAB — CBC WITH DIFFERENTIAL/PLATELET
BASOS ABS: 0.1 10*3/uL (ref 0.0–0.1)
BASOS PCT: 0.6 % (ref 0.0–3.0)
EOS ABS: 0.2 10*3/uL (ref 0.0–0.7)
Eosinophils Relative: 2.7 % (ref 0.0–5.0)
HEMATOCRIT: 42.8 % (ref 36.0–46.0)
HEMOGLOBIN: 14.1 g/dL (ref 12.0–15.0)
LYMPHS PCT: 25.7 % (ref 12.0–46.0)
Lymphs Abs: 2.3 10*3/uL (ref 0.7–4.0)
MCHC: 32.8 g/dL (ref 30.0–36.0)
MCV: 95.1 fl (ref 78.0–100.0)
Monocytes Absolute: 0.5 10*3/uL (ref 0.1–1.0)
Monocytes Relative: 5.6 % (ref 3.0–12.0)
Neutro Abs: 5.8 10*3/uL (ref 1.4–7.7)
Neutrophils Relative %: 65.4 % (ref 43.0–77.0)
Platelets: 185 10*3/uL (ref 150.0–400.0)
RBC: 4.5 Mil/uL (ref 3.87–5.11)
RDW: 13.4 % (ref 11.5–15.5)
WBC: 8.9 10*3/uL (ref 4.0–10.5)

## 2017-04-02 LAB — BASIC METABOLIC PANEL
BUN: 14 mg/dL (ref 6–23)
CALCIUM: 10 mg/dL (ref 8.4–10.5)
CHLORIDE: 107 meq/L (ref 96–112)
CO2: 29 mEq/L (ref 19–32)
CREATININE: 0.65 mg/dL (ref 0.40–1.20)
GFR: 93.29 mL/min (ref >60.00)
Glucose, Bld: 99 mg/dL (ref 70–99)
Potassium: 4 mEq/L (ref 3.5–5.1)
Sodium: 143 mEq/L (ref 135–145)

## 2017-04-02 NOTE — Patient Instructions (Addendum)
You should see your eye doctor about your eyelid area this could be blepharitis or a very mild eyelid infection.  Blood work today screening for cholesterol and blood sugar and kidney function. I do not see compelling reason for you to continue the aspirin if you have risk of bleeding and bruising but you can decide this on your own. Your side buttocks pain may be positional musculoskeletal try sleeping on the other side sometimes bursitis will do this follow-up with orthopedics if persistent and progressive. Flu vaccine is a high value low cost vaccine you can get this at any time pharmacy elsewhere. Advise being tobacco free. Advise getting a bone density and consider intervention if you have osteoporosis to avoid future fracture. Continue weightbearing activity and exercise as you are doing. Glad your kidney stones are better.  I will review your record for any loose ends  .      Preventive Care 79 Years and Older, Female Preventive care refers to lifestyle choices and visits with your health care provider that can promote health and wellness. What does preventive care include?  A yearly physical exam. This is also called an annual well check.  Dental exams once or twice a year.  Routine eye exams. Ask your health care provider how often you should have your eyes checked.  Personal lifestyle choices, including: ? Daily care of your teeth and gums. ? Regular physical activity. ? Eating a healthy diet. ? Avoiding tobacco and drug use. ? Limiting alcohol use. ? Practicing safe sex. ? Taking low-dose aspirin every day. ? Taking vitamin and mineral supplements as recommended by your health care provider. What happens during an annual well check? The services and screenings done by your health care provider during your annual well check will depend on your age, overall health, lifestyle risk factors, and family history of disease. Counseling Your health care provider may ask you  questions about your:  Alcohol use.  Tobacco use.  Drug use.  Emotional well-being.  Home and relationship well-being.  Sexual activity.  Eating habits.  History of falls.  Memory and ability to understand (cognition).  Work and work Statistician.  Reproductive health.  Screening You may have the following tests or measurements:  Height, weight, and BMI.  Blood pressure.  Lipid and cholesterol levels. These may be checked every 5 years, or more frequently if you are over 66 years old.  Skin check.  Lung cancer screening. You may have this screening every year starting at age 9 if you have a 30-pack-year history of smoking and currently smoke or have quit within the past 15 years.  Fecal occult blood test (FOBT) of the stool. You may have this test every year starting at age 59.  Flexible sigmoidoscopy or colonoscopy. You may have a sigmoidoscopy every 5 years or a colonoscopy every 10 years starting at age 13.  Hepatitis C blood test.  Hepatitis B blood test.  Sexually transmitted disease (STD) testing.  Diabetes screening. This is done by checking your blood sugar (glucose) after you have not eaten for a while (fasting). You may have this done every 1-3 years.  Bone density scan. This is done to screen for osteoporosis. You may have this done starting at age 18.  Mammogram. This may be done every 1-2 years. Talk to your health care provider about how often you should have regular mammograms.  Talk with your health care provider about your test results, treatment options, and if necessary, the need for more  tests. Vaccines Your health care provider may recommend certain vaccines, such as:  Influenza vaccine. This is recommended every year.  Tetanus, diphtheria, and acellular pertussis (Tdap, Td) vaccine. You may need a Td booster every 10 years.  Varicella vaccine. You may need this if you have not been vaccinated.  Zoster vaccine. You may need this  after age 45.  Measles, mumps, and rubella (MMR) vaccine. You may need at least one dose of MMR if you were born in 1957 or later. You may also need a second dose.  Pneumococcal 13-valent conjugate (PCV13) vaccine. One dose is recommended after age 27.  Pneumococcal polysaccharide (PPSV23) vaccine. One dose is recommended after age 51.  Meningococcal vaccine. You may need this if you have certain conditions.  Hepatitis A vaccine. You may need this if you have certain conditions or if you travel or work in places where you may be exposed to hepatitis A.  Hepatitis B vaccine. You may need this if you have certain conditions or if you travel or work in places where you may be exposed to hepatitis B.  Haemophilus influenzae type b (Hib) vaccine. You may need this if you have certain conditions.  Talk to your health care provider about which screenings and vaccines you need and how often you need them. This information is not intended to replace advice given to you by your health care provider. Make sure you discuss any questions you have with your health care provider. Document Released: 06/17/2015 Document Revised: 02/08/2016 Document Reviewed: 03/22/2015 Elsevier Interactive Patient Education  2017 [employer].

## 2017-04-09 ENCOUNTER — Ambulatory Visit (INDEPENDENT_AMBULATORY_CARE_PROVIDER_SITE_OTHER)
Admission: RE | Admit: 2017-04-09 | Discharge: 2017-04-09 | Disposition: A | Discharge status: Home / Self Care | Payer: Medicare Other | Source: Ambulatory Visit | Attending: Internal Medicine | Admitting: Internal Medicine

## 2017-04-09 ENCOUNTER — Encounter: Payer: Self-pay | Admitting: Emergency Medicine

## 2017-04-09 ENCOUNTER — Other Ambulatory Visit: Payer: Medicare Other

## 2017-04-09 DIAGNOSIS — E2839 Other primary ovarian failure: Secondary | ICD-10-CM

## 2017-04-09 DIAGNOSIS — F17200 Nicotine dependence, unspecified, uncomplicated: Secondary | ICD-10-CM

## 2017-04-30 IMAGING — US US ABDOMEN COMPLETE
1 series · 13 of 25 positions shown · non-contrast
Comparison: Ultrasound 02/26/2016.  MRI 09/05/2005 .

CLINICAL DATA: Vomiting.  Pain .

EXAM:
ABDOMEN ULTRASOUND COMPLETE

[Series 1: us abdomen complete · 0.28mm/px · 13 of 91 slices shown]
[im 1/91]
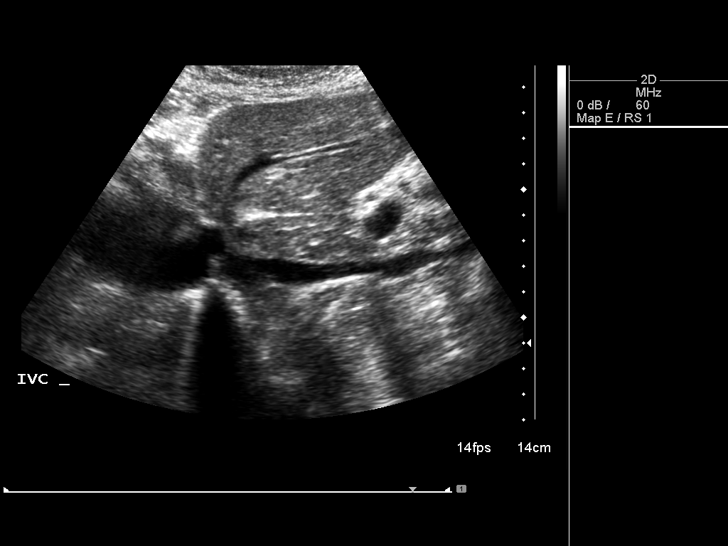
[im 8/91]
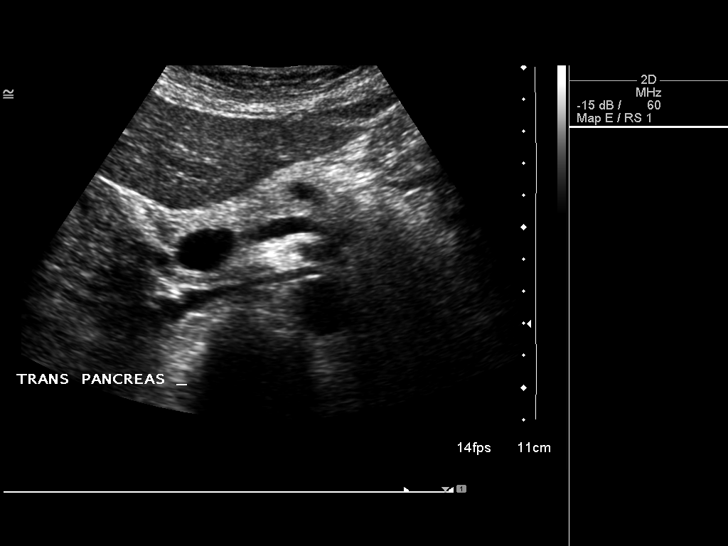
[im 16/91]
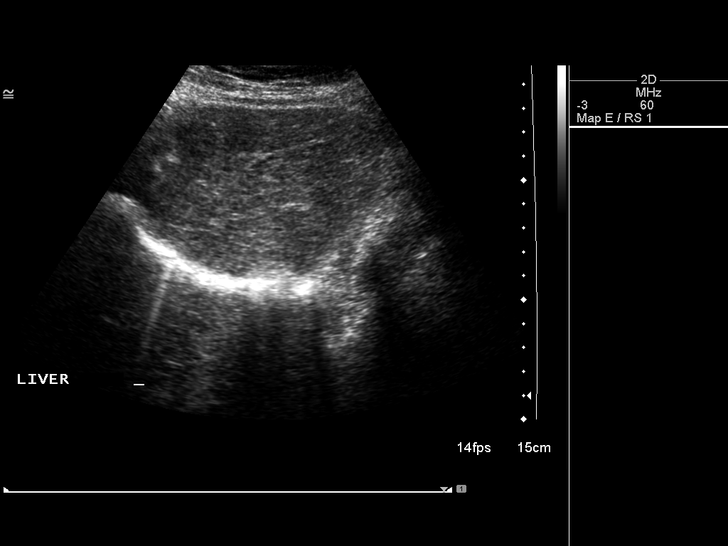
[im 23/91]
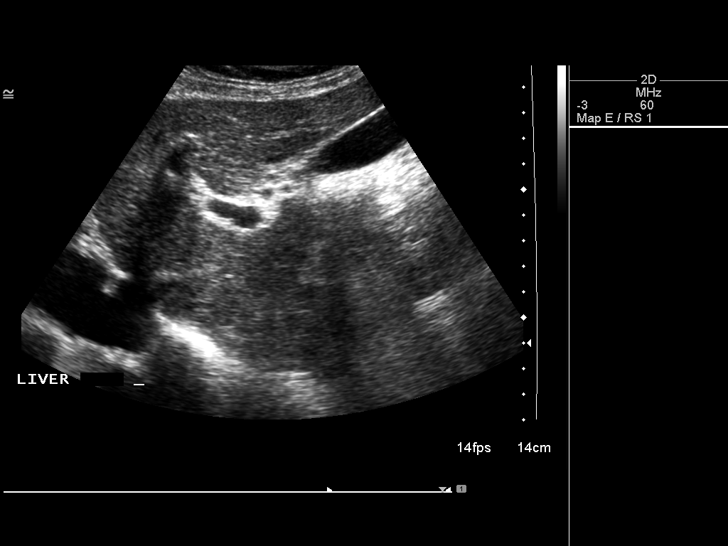
[im 31/91]
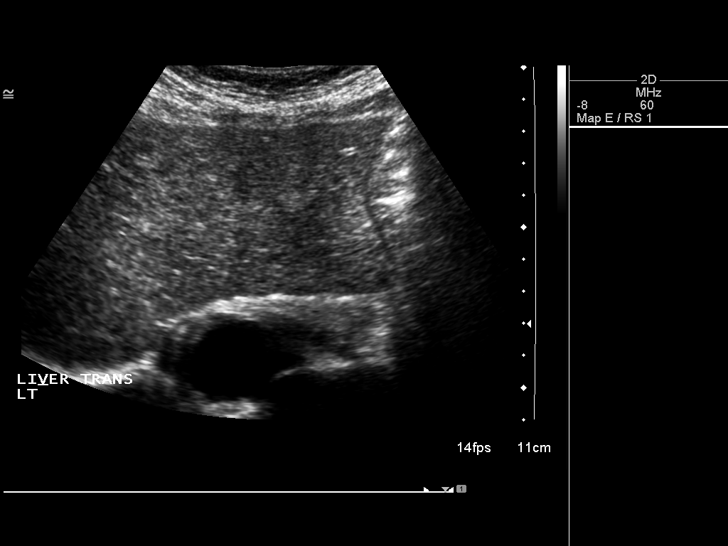
[im 38/91]
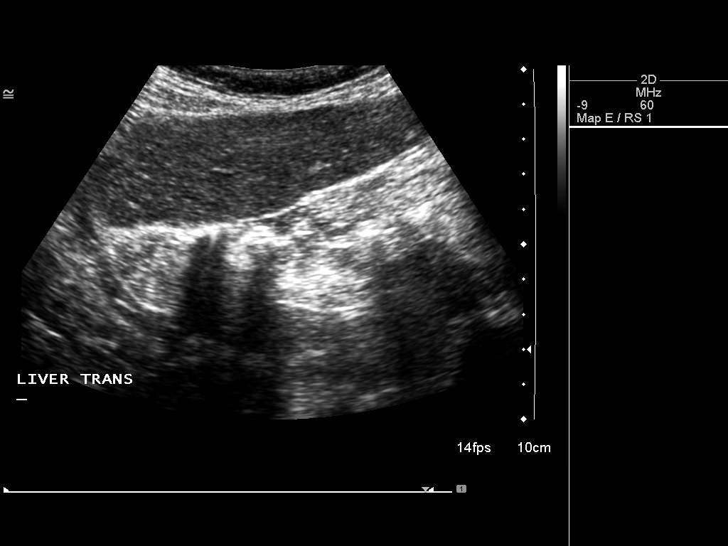
[im 46/91]
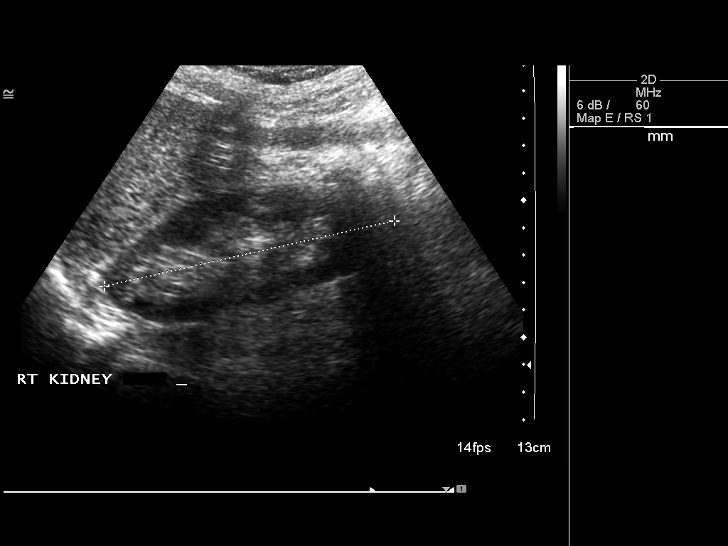
[im 53/91]
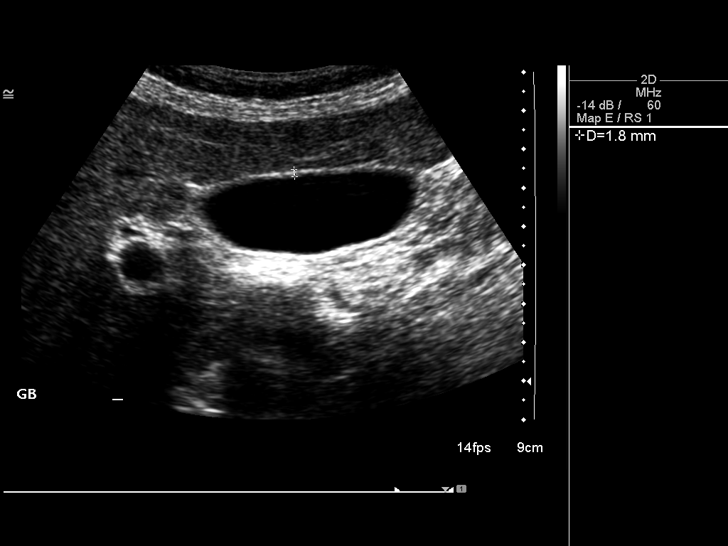
[im 61/91]
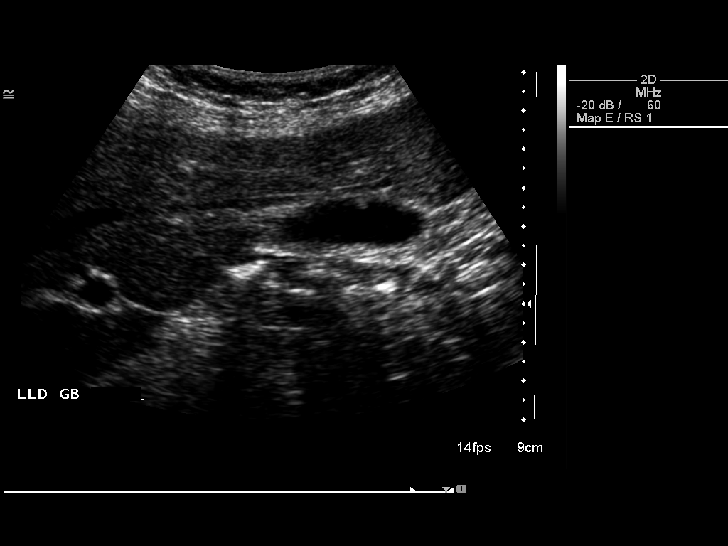
[im 68/91]
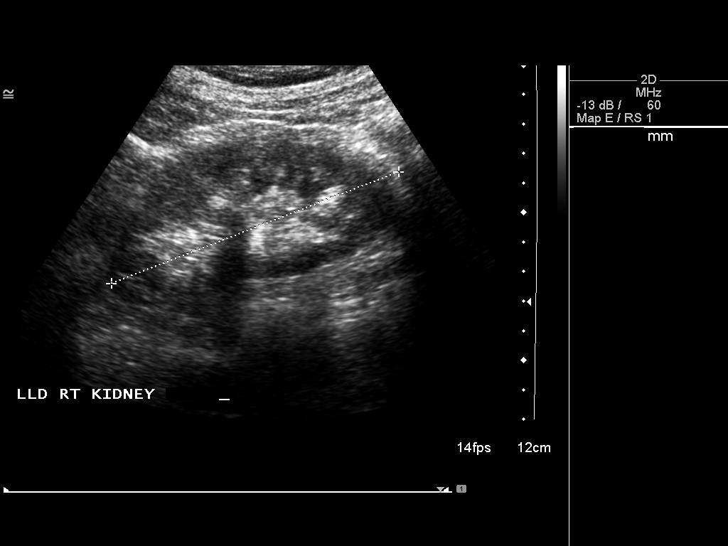
[im 76/91]
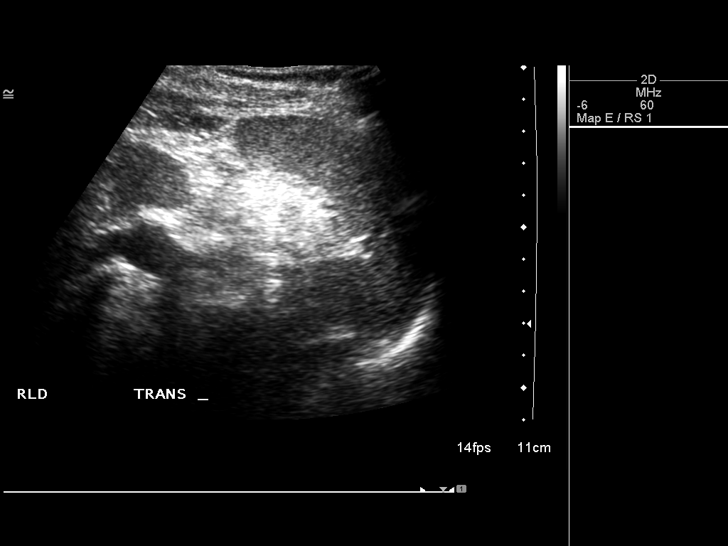
[im 83/91]
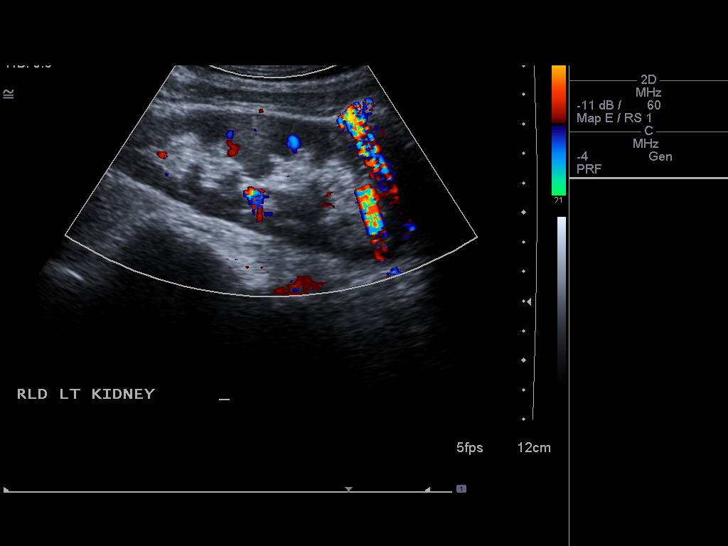
[im 91/91]
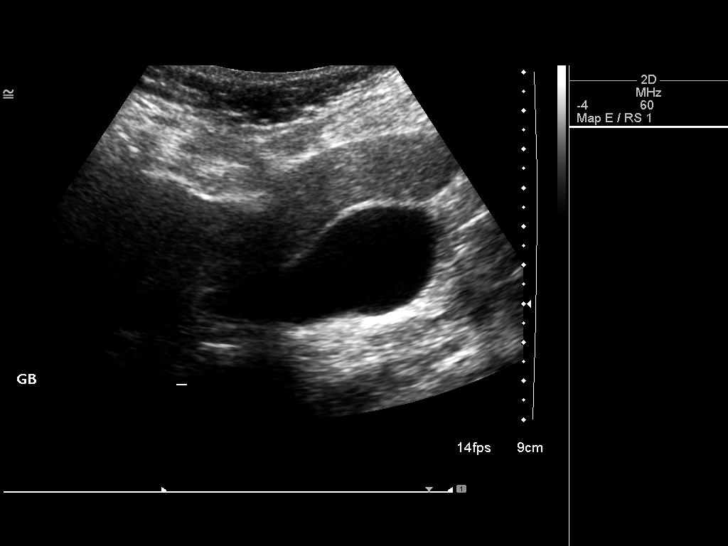

[13 of 25 positions shown; findings below may reference images not displayed]

FINDINGS: Gallbladder: 3 mm gallbladder polyp noted along the anterior wall
gallbladder . No gallstones. No gallbladder wall thickening.

Common bile duct: Diameter: 5.9 mm

Liver: Liver has a heterogeneous parenchymal pattern consistent
fatty infiltration and/or hepatocellular disease. Slightly lobular
contour of the liver is noted. Cirrhosis cannot be excluded. No
focal hepatic abnormality identified.

IVC: No abnormality visualized.

Pancreas: Visualized portion unremarkable.

Spleen: Size and appearance within normal limits.

Right Kidney: Length: 10.9 cm. Echogenicity within normal limits. No
mass or hydronephrosis visualized.

Left Kidney: Length: 11.4 cm. Echogenicity within normal limits. No
mass or hydronephrosis visualized.

Abdominal aorta: No aneurysm visualized.

Other findings: None.
IMPRESSION: 1. Liver has a heterogeneous parenchymal pattern consistent fatty
infiltration and/or hepatocellular disease. Liver has a slightly
lobular contour consistent with cirrhosis. No focal hepatic
abnormality identified.

2. 3 mm gallbladder polyp. No gallstones. No evidence of
cholecystitis. No biliary distention.

## 2017-04-30 NOTE — Progress Notes (Signed)
Chief Complaint  Patient presents with  . Follow-up    labs    HPI: Megan Lucas 81 y.o. come in for  Fu dexa scan  No fam hx known  No fracture hx  Has djd scoliosis spine.  Dec hearing  Aid uses pn Runny allergy eyes  Still hasnt seen ey doc  ROS: See pertinent positives and negatives per HPI.  Past Medical History:  Diagnosis Date  . Cataract   . Chronic kidney disease    kidney stone left 2018  . Colon polyp, hyperplastic   . Cough, persistent 09/05/2012   Suspect COPD symptoms rule out infection other.   . Diverticulosis   . Environmental allergies   . History of arm fracture    Left  . Osteoarthritis   . Osteoporosis    DEXA 2007-3.2 spine -1.6 hip    Family History  Problem Relation Age of Onset  . Breast cancer Unknown   . Lung disease Unknown   . Stroke Father 94  . Prostate cancer Father   . Other Mother        Died MVA sister died MVA age 39  . Colon cancer Neg Hx     Social History   Socioeconomic History  . Marital status: Divorced    Spouse name: None  . Number of children: None  . Years of education: None  . Highest education level: None  Social Needs  . Financial resource strain: None  . Food insecurity - worry: None  . Food insecurity - inability: None  . Transportation needs - medical: None  . Transportation needs - non-medical: None  Occupational History  . None  Tobacco Use  . Smoking status: Current Every Day Smoker    Packs/day: 0.50    Types: Cigarettes  . Smokeless tobacco: Never Used  . Tobacco comment: smokes only in evening - states she does not inhale (04/02/17)  Substance and Sexual Activity  . Alcohol use: No  . Drug use: No  . Sexual activity: None  Other Topics Concern  . None  Social History Narrative   Childbirth x2 g2 p2    Negative alcohol tobacco times many years 8 to 10 in the evening   Does do regular exercise seat belt use.   Divorced widowed Public house manager   No pets    Outpatient  Medications Prior to Visit  Medication Sig Dispense Refill  . aspirin EC 81 MG tablet Take 81 mg by mouth daily.    . Calcium Carbonate-Vit D-Min (CALCIUM 1200 PO) Take by mouth 2 (two) times daily.    . Ginger, Zingiber officinalis, (GINGER ROOT PO) Take by mouth.    Marland Kitchen glucosamine-chondroitin 500-400 MG tablet Take 1 tablet by mouth 2 (two) times daily.    Marland Kitchen HYDROcodone-acetaminophen (NORCO/VICODIN) 5-325 MG tablet Take 1 tablet by mouth every 6 (six) hours as needed for moderate pain.    Marland Kitchen LUTEIN PO Take 1 tablet by mouth daily.    . Multiple Vitamin (MULTIVITAMIN) tablet Take 1 tablet by mouth daily.    . Omega-3 Fatty Acids (FISH OIL PO) Take by mouth daily.    Marland Kitchen POTASSIUM CITRATE PO Take by mouth daily.    . TURMERIC PO Take by mouth.     No facility-administered medications prior to visit.      EXAM:  BP 102/66 (BP Location: Right Arm, Patient Position: Sitting, Cuff Size: Normal)   Pulse 67   Temp 98.2 F (36.8 C) (Oral)  Wt 112 lb (50.8 kg)   BMI 23.41 kg/m   Body mass index is 23.41 kg/m.  GENERAL: vitals reviewed and listed above, alert, oriented, appears well hydrated and in no acute distress  weepy eyes HEENT: atraumatic, conjunctiva  clear, no obvious abnormalities on inspection of external nose and ears PSYCH: pleasant and cooperative, no obvious depression or anxiety Lab Results  Component Value Date   WBC 8.9 04/02/2017   HGB 14.1 04/02/2017   HCT 42.8 04/02/2017   PLT 185.0 04/02/2017   GLUCOSE 99 04/02/2017   CHOL 197 04/02/2017   TRIG 106.0 04/02/2017   HDL 58.60 04/02/2017   LDLDIRECT 135.6 06/13/2009   LDLCALC 118 (H) 04/02/2017   ALT 11 04/02/2017   AST 14 04/02/2017   NA 143 04/02/2017   K 4.0 04/02/2017   CL 107 04/02/2017   CREATININE 0.65 04/02/2017   BUN 14 04/02/2017   CO2 29 04/02/2017   TSH 0.89 10/21/2013   INR 1.0 02/24/2016   BP Readings from Last 3 Encounters:  05/01/17 102/66  04/02/17 100/72  08/13/16 134/76  Results:   Lumbar spine L1-L3 (L4) Femoral neck (FN)  T-score -3.7 RFN: -2.2 LFN: -1.9     ASSESSMENT AND PLAN:  Discussed the following assessment and plan:  Osteoporosis without current pathological fracture, unspecified osteoporosis type - Plan: VITAMIN D 25 Hydroxy (Vit-D Deficiency, Fractures), PTH, intact and calcium  Decreased hearing, unspecified laterality reviewed dexa  And implications   HO utd printed  About choices and  bisphosphonate and prolia etc   Risk benefit of medication discussed. Last vit d 2014.  Check vit d today and pth. Pt  Reluctant  To begin   Med At this time   Will optimize  Vit d exercise  And repeat dexa in 2 years but  Advise begin some other intervention Total visit 69mins > 50% spent counseling and coordinating care as indicated in above note and in instructions to patient .   Advise see opthal about eyes   -Patient advised to return or notify health care team  if  new concerns arise.  Patient Instructions  Checking vit d today .  And also  PTH    Level    Optimize  Vit d     800 n- 1000 iu per day and sometimes even more. Needed.   Consideration for     prolia  Of  bisphosphonates .  Plan bone density in 2 years .     Preventing Osteoporosis, Adult Osteoporosis is a condition that causes the bones to get weaker. With osteoporosis, the bones become thinner, and the normal spaces in bone tissue become larger. This can make the bones weak and cause them to break more easily. People who have osteoporosis are more likely to break their wrist, spine, or hip. Even a minor accident or injury can be enough to break weak bones. Osteoporosis can occur with aging. Your body constantly replaces old bone tissue with new tissue. As you get older, you may lose bone tissue more quickly, or it may be replaced more slowly. Osteoporosis is more likely to develop if you have poor nutrition or do not get enough calcium or vitamin D. Other lifestyle factors can also play a role.  By making some diet and lifestyle changes, you can help to keep your bones healthy and help to prevent osteoporosis. What nutrition changes can be made? Nutrition plays an important role in maintaining healthy, strong bones.  Make sure you get enough  calcium every day from food or from calcium supplements. ? If you are age 7 or younger, aim to get 1,000 mg of calcium every day. ? If you are older than age 72, aim to get 1,200 mg of calcium every day.  Try to get enough vitamin D every day. ? If you are age 24 or younger, aim to get 600 international units (IU) every day. ? If you are older than age 68, aim to get 800 international units every day.  Follow a healthy diet. Eat plenty of foods that contain calcium and vitamin D. ? Calcium is in milk, cheese, yogurt, and other dairy products. Some fish and vegetables are also good sources of calcium. Many foods such as cereals and breads have had calcium added to them (are fortified). Check nutrition labels to see how much calcium is in a food or drink. ? Foods that contain vitamin D include milk, cereals, salmon, and tuna. Your body also makes vitamin D when you are out in the sun. Bare skin exposure to the sun on your face, arms, legs, or back for no more than 30 minutes a day, 2 times per week is more than enough. Beyond that, it is important to use sunblock to protect your skin from sunburn, which increases your risk for skin cancer.  What lifestyle changes can be made? Making changes in your everyday life can also play an important role in preventing osteoporosis.  Stay active and get exercise every day. Ask your health care provider what types of exercise are best for you.  Do not use any products that contain nicotine or tobacco, such as cigarettes and e-cigarettes. If you need help quitting, ask your health care provider.  Limit alcohol intake to no more than 1 drink a day for nonpregnant women and 2 drinks a day for men. One drink equals  12 oz of beer, 5 oz of wine, or 1 oz of hard liquor.  Why are these changes important? Making these nutrition and lifestyle changes can:  Help you develop and maintain healthy, strong bones.  Prevent loss of bone mass and the problems that are caused by that loss, such as broken bones and delayed healing.  Make you feel better mentally and physically.  What can happen if changes are not made? Problems that can result from osteoporosis can be very serious. These may include:  A higher risk of broken bones that are painful and do not heal well.  Physical malformations, such as a collapsed spine or a hunched back.  Problems with movement.  Where to find support: If you need help making changes to prevent osteoporosis, talk with your health care provider. You can ask for a referral to a diet and nutrition specialist (dietitian) and a physical therapist. Where to find more information: Learn more about osteoporosis from:  NIH Osteoporosis and Related [place]: www.niams.GolfingGoddess.com.br  U.S. Office on Women's Health: SouvenirBaseball.es.html  National Osteoporosis Foundation: ProfilePeek.ch  Summary  Osteoporosis is a condition that causes weak bones that are more likely to break.  Eating a healthy diet and making sure you get enough calcium and vitamin D can help prevent osteoporosis.  Other ways to reduce your risk of osteoporosis include getting regular exercise and avoiding alcohol and products that contain nicotine or tobacco. This information is not intended to replace advice given to you by your health care provider. Make sure you discuss any questions you have with your health care provider. Document Released: 06/05/2015  Document Revised: 01/30/2016 Document Reviewed: 01/30/2016 Elsevier Interactive Patient Education   2018 [place]. Madisyn Mawhinney M.D.

## 2017-05-01 ENCOUNTER — Encounter: Payer: Self-pay | Admitting: Internal Medicine

## 2017-05-01 ENCOUNTER — Ambulatory Visit (INDEPENDENT_AMBULATORY_CARE_PROVIDER_SITE_OTHER): Payer: Medicare Other | Admitting: Internal Medicine

## 2017-05-01 VITALS — BP 102/66 | HR 67 | Temp 98.2°F | Wt 112.0 lb

## 2017-05-01 DIAGNOSIS — H9190 Unspecified hearing loss, unspecified ear: Secondary | ICD-10-CM | POA: Diagnosis not present

## 2017-05-01 DIAGNOSIS — M810 Age-related osteoporosis without current pathological fracture: Secondary | ICD-10-CM

## 2017-05-01 LAB — VITAMIN D 25 HYDROXY (VIT D DEFICIENCY, FRACTURES): VITD: 43.34 ng/mL (ref 30.00–100.00)

## 2017-05-01 NOTE — Patient Instructions (Addendum)
Checking vit d today .  And also  PTH    Level    Optimize  Vit d     800 n- 1000 iu per day and sometimes even more. Needed.   Consideration for     prolia  Of  bisphosphonates .  Plan bone density in 2 years .     Preventing Osteoporosis, Adult Osteoporosis is a condition that causes the bones to get weaker. With osteoporosis, the bones become thinner, and the normal spaces in bone tissue become larger. This can make the bones weak and cause them to break more easily. People who have osteoporosis are more likely to break their wrist, spine, or hip. Even a minor accident or injury can be enough to break weak bones. Osteoporosis can occur with aging. Your body constantly replaces old bone tissue with new tissue. As you get older, you may lose bone tissue more quickly, or it may be replaced more slowly. Osteoporosis is more likely to develop if you have poor nutrition or do not get enough calcium or vitamin D. Other lifestyle factors can also play a role. By making some diet and lifestyle changes, you can help to keep your bones healthy and help to prevent osteoporosis. What nutrition changes can be made? Nutrition plays an important role in maintaining healthy, strong bones.  Make sure you get enough calcium every day from food or from calcium supplements. ? If you are age 61 or younger, aim to get 1,000 mg of calcium every day. ? If you are older than age 25, aim to get 1,200 mg of calcium every day.  Try to get enough vitamin D every day. ? If you are age 106 or younger, aim to get 600 international units (IU) every day. ? If you are older than age 45, aim to get 800 international units every day.  Follow a healthy diet. Eat plenty of foods that contain calcium and vitamin D. ? Calcium is in milk, cheese, yogurt, and other dairy products. Some fish and vegetables are also good sources of calcium. Many foods such as cereals and breads have had calcium added to them (are fortified). Check  nutrition labels to see how much calcium is in a food or drink. ? Foods that contain vitamin D include milk, cereals, salmon, and tuna. Your body also makes vitamin D when you are out in the sun. Bare skin exposure to the sun on your face, arms, legs, or back for no more than 30 minutes a day, 2 times per week is more than enough. Beyond that, it is important to use sunblock to protect your skin from sunburn, which increases your risk for skin cancer.  What lifestyle changes can be made? Making changes in your everyday life can also play an important role in preventing osteoporosis.  Stay active and get exercise every day. Ask your health care provider what types of exercise are best for you.  Do not use any products that contain nicotine or tobacco, such as cigarettes and e-cigarettes. If you need help quitting, ask your health care provider.  Limit alcohol intake to no more than 1 drink a day for nonpregnant women and 2 drinks a day for men. One drink equals 12 oz of beer, 5 oz of wine, or 1 oz of hard liquor.  Why are these changes important? Making these nutrition and lifestyle changes can:  Help you develop and maintain healthy, strong bones.  Prevent loss of bone mass and the problems that  are caused by that loss, such as broken bones and delayed healing.  Make you feel better mentally and physically.  What can happen if changes are not made? Problems that can result from osteoporosis can be very serious. These may include:  A higher risk of broken bones that are painful and do not heal well.  Physical malformations, such as a collapsed spine or a hunched back.  Problems with movement.  Where to find support: If you need help making changes to prevent osteoporosis, talk with your health care provider. You can ask for a referral to a diet and nutrition specialist (dietitian) and a physical therapist. Where to find more information: Learn more about osteoporosis from:  NIH  Osteoporosis and Related [place]: www.niams.GolfingGoddess.com.br  U.S. Office on Women's Health: SouvenirBaseball.es.html  National Osteoporosis Foundation: ProfilePeek.ch  Summary  Osteoporosis is a condition that causes weak bones that are more likely to break.  Eating a healthy diet and making sure you get enough calcium and vitamin D can help prevent osteoporosis.  Other ways to reduce your risk of osteoporosis include getting regular exercise and avoiding alcohol and products that contain nicotine or tobacco. This information is not intended to replace advice given to you by your health care provider. Make sure you discuss any questions you have with your health care provider. Document Released: 06/05/2015 Document Revised: 01/30/2016 Document Reviewed: 01/30/2016 Elsevier Interactive Patient Education  [employer].

## 2017-05-02 LAB — PTH, INTACT AND CALCIUM
Calcium: 9.6 mg/dL (ref 8.6–10.4)
PTH: 42 pg/mL (ref 14–64)

## 2017-05-03 ENCOUNTER — Telehealth: Payer: Self-pay | Admitting: *Deleted

## 2017-05-17 NOTE — Telephone Encounter (Signed)
error 

## 2017-09-19 DIAGNOSIS — H10413 Chronic giant papillary conjunctivitis, bilateral: Secondary | ICD-10-CM | POA: Diagnosis not present

## 2017-11-08 IMAGING — CR DG ABDOMEN 1V
1 series · 1 of 1 positions shown · non-contrast
Comparison: 07/25/2016

CLINICAL DATA: History of esophageal cancer preop left kidney
stone.

EXAM:
ABDOMEN - 1 VIEW

[t abdomen supine]
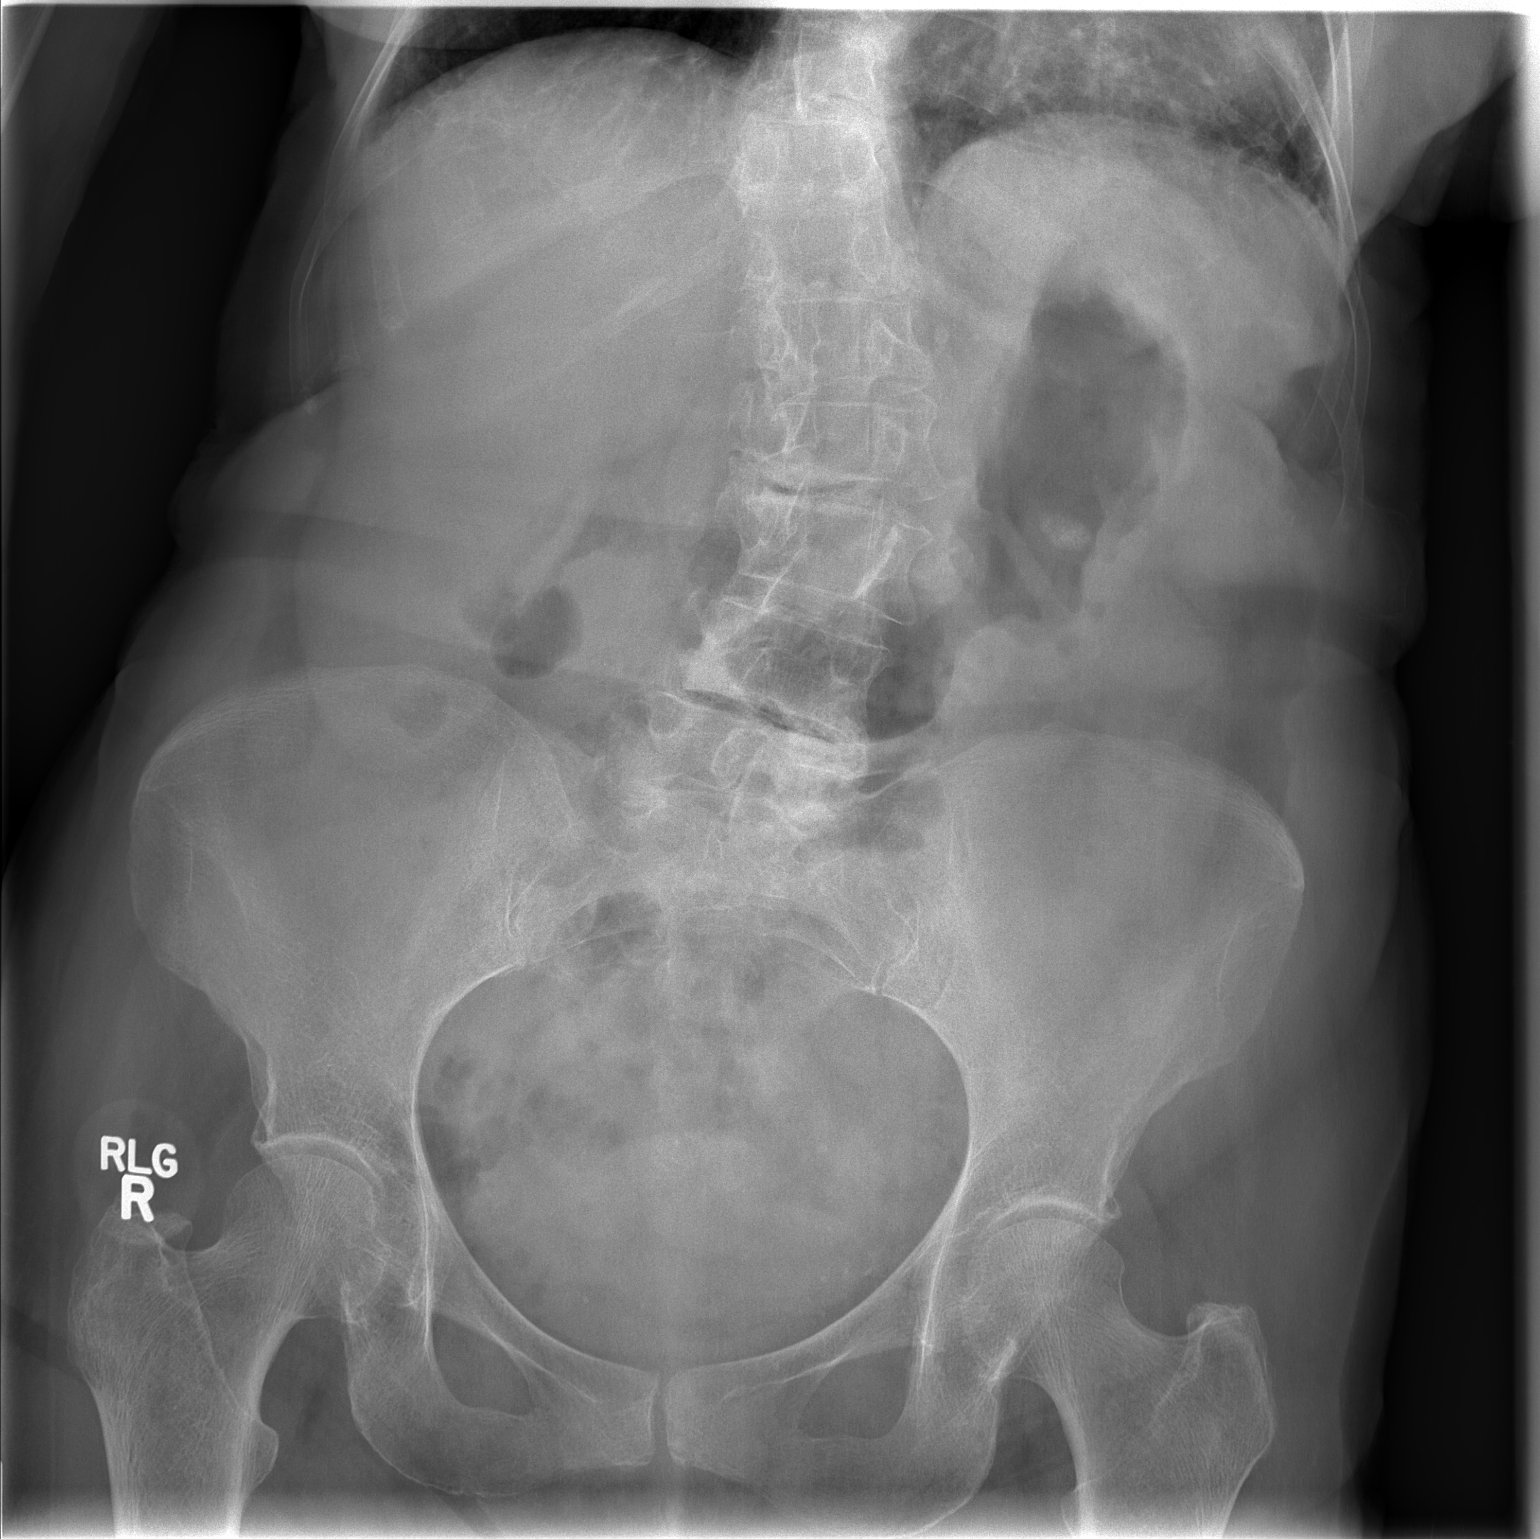

[1 of 1 positions shown; findings below may reference images not displayed]

FINDINGS: There is a scoliosis deformity involving the lumbar spine which is
convex towards the left. Multi level degenerative disc disease
identified. The left UPJ calculus is again noted with a maximum
dimension of 1.3 cm.
IMPRESSION: 1. Left UPJ calculus measures 1.3 cm.

## 2018-01-01 DIAGNOSIS — Z1231 Encounter for screening mammogram for malignant neoplasm of breast: Secondary | ICD-10-CM | POA: Diagnosis not present

## 2018-01-01 LAB — HM MAMMOGRAPHY

## 2018-01-21 ENCOUNTER — Encounter: Payer: Self-pay | Admitting: Internal Medicine

## 2018-02-28 DIAGNOSIS — N200 Calculus of kidney: Secondary | ICD-10-CM | POA: Diagnosis not present

## 2018-04-14 DIAGNOSIS — H524 Presbyopia: Secondary | ICD-10-CM | POA: Diagnosis not present

## 2018-04-14 DIAGNOSIS — H2511 Age-related nuclear cataract, right eye: Secondary | ICD-10-CM | POA: Diagnosis not present

## 2018-05-07 ENCOUNTER — Telehealth: Payer: Self-pay | Admitting: Internal Medicine

## 2018-05-07 NOTE — Telephone Encounter (Signed)
Her nephew came into town and he had a rash. He went to the doctor and was diagnosed with Shingles. She was wanting to know if she should get the shingrix injection since she's been exposed. Sheena did let the patient know that she would have to get the injection from her pharmacy.

## 2018-06-12 DIAGNOSIS — H25811 Combined forms of age-related cataract, right eye: Secondary | ICD-10-CM | POA: Diagnosis not present

## 2018-06-12 DIAGNOSIS — H2511 Age-related nuclear cataract, right eye: Secondary | ICD-10-CM | POA: Diagnosis not present

## 2019-01-07 ENCOUNTER — Encounter: Payer: Self-pay | Admitting: Internal Medicine

## 2019-01-07 DIAGNOSIS — Z1231 Encounter for screening mammogram for malignant neoplasm of breast: Secondary | ICD-10-CM | POA: Diagnosis not present

## 2019-01-13 DIAGNOSIS — L820 Inflamed seborrheic keratosis: Secondary | ICD-10-CM | POA: Diagnosis not present

## 2019-01-13 DIAGNOSIS — L719 Rosacea, unspecified: Secondary | ICD-10-CM | POA: Diagnosis not present

## 2019-01-13 DIAGNOSIS — L708 Other acne: Secondary | ICD-10-CM | POA: Diagnosis not present

## 2019-01-13 DIAGNOSIS — L821 Other seborrheic keratosis: Secondary | ICD-10-CM | POA: Diagnosis not present

## 2019-01-13 DIAGNOSIS — L570 Actinic keratosis: Secondary | ICD-10-CM | POA: Diagnosis not present

## 2019-01-17 ENCOUNTER — Encounter: Payer: Self-pay | Admitting: Internal Medicine

## 2019-02-17 DIAGNOSIS — L821 Other seborrheic keratosis: Secondary | ICD-10-CM | POA: Diagnosis not present

## 2019-02-17 DIAGNOSIS — L719 Rosacea, unspecified: Secondary | ICD-10-CM | POA: Diagnosis not present

## 2019-02-17 DIAGNOSIS — L570 Actinic keratosis: Secondary | ICD-10-CM | POA: Diagnosis not present

## 2019-07-01 ENCOUNTER — Ambulatory Visit: Payer: Medicare Other

## 2019-07-09 ENCOUNTER — Ambulatory Visit: Payer: Medicare Other | Attending: Internal Medicine

## 2019-07-09 DIAGNOSIS — Z23 Encounter for immunization: Secondary | ICD-10-CM

## 2019-07-09 NOTE — Progress Notes (Signed)
   Covid-19 Vaccination Clinic  Name:  Megan Lucas    MRN: [id number] DOB: 06-26-1935  07/09/2019  [name] was observed post Covid-19 immunization for 15 minutes without incidence. She was provided with Vaccine Information Sheet and instruction to access the V-Safe system.   [name] was instructed to call 911 with any severe reactions post vaccine: Marland Kitchen Difficulty breathing  . Swelling of your face and throat  . A fast heartbeat  . A bad rash all over your body  . Dizziness and weakness    Immunizations Administered    Name Date Dose VIS Date Route   Pfizer COVID-19 Vaccine 07/09/2019 12:59 PM 0.3 mL 05/15/2019 Intramuscular   Manufacturer: [place]   Lot: [id number]   [place]: [id number]

## 2019-07-15 DIAGNOSIS — H02403 Unspecified ptosis of bilateral eyelids: Secondary | ICD-10-CM | POA: Diagnosis not present

## 2019-07-15 DIAGNOSIS — Z961 Presence of intraocular lens: Secondary | ICD-10-CM | POA: Diagnosis not present

## 2019-08-03 ENCOUNTER — Ambulatory Visit: Payer: Medicare Other | Attending: Internal Medicine

## 2019-08-03 DIAGNOSIS — Z23 Encounter for immunization: Secondary | ICD-10-CM

## 2019-08-03 NOTE — Progress Notes (Signed)
   Covid-19 Vaccination Clinic  Name:  Megan Lucas    MRN: [id number] DOB: [birth year]  08/03/2019  [name] was observed post Covid-19 immunization for 15 minutes without incidence. She was provided with Vaccine Information Sheet and instruction to access the V-Safe system.   [name] was instructed to call 911 with any severe reactions post vaccine: Marland Kitchen Difficulty breathing  . Swelling of your face and throat  . A fast heartbeat  . A bad rash all over your body  . Dizziness and weakness    Immunizations Administered    Name Date Dose VIS Date Route   Pfizer COVID-19 Vaccine 08/03/2019  3:55 PM 0.3 mL 05/15/2019 Intramuscular   Manufacturer: [place]   Lot: [id number]   [place]: [id number]      Covid-19 Vaccination Clinic  Name:  Megan Lucas    MRN: [id number] DOB: 1935/09/14  08/03/2019  [name] was observed post Covid-19 immunization for 15 minutes without incidence. She was provided with Vaccine Information Sheet and instruction to access the V-Safe system.   [name] was instructed to call 911 with any severe reactions post vaccine: Marland Kitchen Difficulty breathing  . Swelling of your face and throat  . A fast heartbeat  . A bad rash all over your body  . Dizziness and weakness    Immunizations Administered    Name Date Dose VIS Date Route   Pfizer COVID-19 Vaccine 08/03/2019  3:55 PM 0.3 mL 05/15/2019 Intramuscular   Manufacturer: [place]   Lot: [id number]   [place]: [id number]

## 2019-09-25 DIAGNOSIS — D485 Neoplasm of uncertain behavior of skin: Secondary | ICD-10-CM | POA: Diagnosis not present

## 2019-09-25 DIAGNOSIS — L570 Actinic keratosis: Secondary | ICD-10-CM | POA: Diagnosis not present

## 2019-09-25 DIAGNOSIS — L578 Other skin changes due to chronic exposure to nonionizing radiation: Secondary | ICD-10-CM | POA: Diagnosis not present

## 2019-09-25 DIAGNOSIS — L719 Rosacea, unspecified: Secondary | ICD-10-CM | POA: Diagnosis not present

## 2019-09-25 DIAGNOSIS — L821 Other seborrheic keratosis: Secondary | ICD-10-CM | POA: Diagnosis not present

## 2019-09-25 DIAGNOSIS — D1801 Hemangioma of skin and subcutaneous tissue: Secondary | ICD-10-CM | POA: Diagnosis not present

## 2019-09-25 DIAGNOSIS — L814 Other melanin hyperpigmentation: Secondary | ICD-10-CM | POA: Diagnosis not present

## 2019-10-08 DIAGNOSIS — N200 Calculus of kidney: Secondary | ICD-10-CM | POA: Diagnosis not present

## 2020-01-01 DIAGNOSIS — I8311 Varicose veins of right lower extremity with inflammation: Secondary | ICD-10-CM | POA: Diagnosis not present

## 2020-01-01 DIAGNOSIS — I8312 Varicose veins of left lower extremity with inflammation: Secondary | ICD-10-CM | POA: Diagnosis not present

## 2020-01-11 DIAGNOSIS — I87393 Chronic venous hypertension (idiopathic) with other complications of bilateral lower extremity: Secondary | ICD-10-CM | POA: Diagnosis not present

## 2020-01-11 DIAGNOSIS — I8311 Varicose veins of right lower extremity with inflammation: Secondary | ICD-10-CM | POA: Diagnosis not present

## 2020-01-11 DIAGNOSIS — I8312 Varicose veins of left lower extremity with inflammation: Secondary | ICD-10-CM | POA: Diagnosis not present

## 2020-02-19 DIAGNOSIS — Z1231 Encounter for screening mammogram for malignant neoplasm of breast: Secondary | ICD-10-CM | POA: Diagnosis not present

## 2020-02-19 LAB — HM MAMMOGRAPHY

## 2020-04-02 ENCOUNTER — Other Ambulatory Visit: Payer: Self-pay

## 2020-04-02 ENCOUNTER — Ambulatory Visit: Payer: Medicare Other | Attending: Internal Medicine

## 2020-04-02 DIAGNOSIS — Z23 Encounter for immunization: Secondary | ICD-10-CM

## 2020-04-02 NOTE — Progress Notes (Signed)
   Covid-19 Vaccination Clinic  Name:  CHARA MARQUARD    MRN: [id number] DOB: 11-25-1935  04/02/2020  Ms. Gervin was observed post Covid-19 immunization for 15 minutes without incident. She was provided with Vaccine Information Sheet and instruction to access the V-Safe system.   Ms. Paff was instructed to call 911 with any severe reactions post vaccine: Marland Kitchen Difficulty breathing  . Swelling of face and throat  . A fast heartbeat  . A bad rash all over body  . Dizziness and weakness

## 2020-07-18 DIAGNOSIS — H02403 Unspecified ptosis of bilateral eyelids: Secondary | ICD-10-CM | POA: Diagnosis not present

## 2020-07-18 DIAGNOSIS — H40013 Open angle with borderline findings, low risk, bilateral: Secondary | ICD-10-CM | POA: Diagnosis not present

## 2020-08-31 DIAGNOSIS — L814 Other melanin hyperpigmentation: Secondary | ICD-10-CM | POA: Diagnosis not present

## 2020-08-31 DIAGNOSIS — D485 Neoplasm of uncertain behavior of skin: Secondary | ICD-10-CM | POA: Diagnosis not present

## 2020-08-31 DIAGNOSIS — L570 Actinic keratosis: Secondary | ICD-10-CM | POA: Diagnosis not present

## 2020-08-31 DIAGNOSIS — L820 Inflamed seborrheic keratosis: Secondary | ICD-10-CM | POA: Diagnosis not present

## 2020-09-12 DIAGNOSIS — L821 Other seborrheic keratosis: Secondary | ICD-10-CM | POA: Diagnosis not present

## 2020-09-12 DIAGNOSIS — D485 Neoplasm of uncertain behavior of skin: Secondary | ICD-10-CM | POA: Diagnosis not present

## 2020-09-12 DIAGNOSIS — L723 Sebaceous cyst: Secondary | ICD-10-CM | POA: Diagnosis not present

## 2020-11-28 ENCOUNTER — Telehealth: Payer: Self-pay | Admitting: Internal Medicine

## 2020-11-28 NOTE — Telephone Encounter (Signed)
Patient scheduled 06/29

## 2020-11-28 NOTE — Telephone Encounter (Signed)
Patient is needing an appointment to check out something with her abdomen area.  Patient has not been seen since 2018 and is wanting to see Dr. Regis Bill again because she states that she had this problem before and she has seen her for it.  Patient would like to know if Dr. Regis Bill will take her on as a new patient.  Please advise.

## 2020-11-28 NOTE — Telephone Encounter (Signed)
OK 

## 2020-11-28 NOTE — Telephone Encounter (Signed)
Left patient a message stating that Dr. Regis Bill will take her on as a new patient and to get her scheduled for an appointment.  No answer, left voicemail stating to call back.

## 2020-11-30 ENCOUNTER — Other Ambulatory Visit: Payer: Self-pay

## 2020-11-30 ENCOUNTER — Ambulatory Visit (INDEPENDENT_AMBULATORY_CARE_PROVIDER_SITE_OTHER): Payer: Medicare Other | Admitting: Internal Medicine

## 2020-11-30 ENCOUNTER — Encounter: Payer: Self-pay | Admitting: Internal Medicine

## 2020-11-30 VITALS — BP 110/60 | HR 88 | Temp 98.9°F | Ht <= 58 in | Wt 117.4 lb

## 2020-11-30 DIAGNOSIS — H9193 Unspecified hearing loss, bilateral: Secondary | ICD-10-CM | POA: Diagnosis not present

## 2020-11-30 DIAGNOSIS — R1900 Intra-abdominal and pelvic swelling, mass and lump, unspecified site: Secondary | ICD-10-CM

## 2020-11-30 DIAGNOSIS — F17200 Nicotine dependence, unspecified, uncomplicated: Secondary | ICD-10-CM

## 2020-11-30 DIAGNOSIS — R222 Localized swelling, mass and lump, trunk: Secondary | ICD-10-CM

## 2020-11-30 DIAGNOSIS — M810 Age-related osteoporosis without current pathological fracture: Secondary | ICD-10-CM

## 2020-11-30 DIAGNOSIS — R140 Abdominal distension (gaseous): Secondary | ICD-10-CM | POA: Diagnosis not present

## 2020-11-30 NOTE — Patient Instructions (Signed)
Lab tests today  Will be contacted about appt for an abdominal ct scan to help delineate .   lump  this seems to be  abdominal wall and not attached to  any organ  . Which is reassuring.

## 2020-11-30 NOTE — Progress Notes (Signed)
Chief Complaint  Patient presents with   Spot on Abdominal     Patient complains of spot on abdominal, x2 weeks, Patient states that the spot moved location about 1 week ago. Patient reports no pain or redness around the spot.    HPI: Megan Lucas 85 y.o. come in for reestablishing after more than 3 years because of a noticeable lump in her upper abdomen.  Thinks it might of been there before but noted over the last few weeks some fullness and wonders if it is related to activity that she has done but noted a lump feeling that used to be on the left upper abdomen is now in the middle and there but denies specific tenderness. No change in bowel habits is very worried or anxious that this is important to her health.  She is able to play tennis denies any major weight loss change in bowel habits Continues to smoke a half a pack per day and states that her lungs are fine. Flew  June 18   recently   had [place] long time in airport.    ROS: See pertinent positives and negatives per HPI. Remote  history of kidney stone. Past Medical History:  Diagnosis Date   Cataract    Chronic kidney disease    kidney stone left 2018   Colon polyp, hyperplastic    Cough, persistent 09/05/2012   Suspect COPD symptoms rule out infection other.    Diverticulosis    Environmental allergies    History of arm fracture    Left   Osteoarthritis    Osteoporosis    DEXA 2007-3.2 spine -1.6 hip    Family History  Problem Relation Age of Onset   Breast cancer Unknown    Lung disease Unknown    Stroke Father 43   Prostate cancer Father    Other Mother        Died MVA sister died MVA age 61   Colon cancer Neg Hx     Social History   Socioeconomic History   Marital status: Divorced    Spouse name: Not on file   Number of children: Not on file   Years of education: Not on file   Highest education level: Not on file  Occupational History   Not on file  Tobacco Use   Smoking status: Every Day     Packs/day: 0.50    Pack years: 0.00    Types: Cigarettes   Smokeless tobacco: Never   Tobacco comments:    smokes only in evening - states she does not inhale (04/02/17)  Substance and Sexual Activity   Alcohol use: No   Drug use: No   Sexual activity: Not on file  Other Topics Concern   Not on file  Social History Narrative   Childbirth x2 g2 p2    Negative alcohol tobacco times many years 8 to 10 in the evening   Does do regular exercise seat belt use.   Divorced widowed Public house manager   No pets   Social Determinants of Health   Financial Resource Strain: Not on file  Food Insecurity: Not on file  Transportation Needs: Not on file  Physical Activity: Not on file  Stress: Not on file  Social Connections: Not on file    Outpatient Medications Prior to Visit  Medication Sig Dispense Refill   aspirin EC 81 MG tablet Take 81 mg by mouth daily.     Calcium Carbonate-Vit D-Min (CALCIUM 1200  PO) Take by mouth 2 (two) times daily.     Ginger, Zingiber officinalis, (GINGER ROOT PO) Take by mouth.     glucosamine-chondroitin 500-400 MG tablet Take 1 tablet by mouth 2 (two) times daily.     LUTEIN PO Take 1 tablet by mouth daily.     Multiple Vitamin (MULTIVITAMIN) tablet Take 1 tablet by mouth daily.     Omega-3 Fatty Acids (FISH OIL PO) Take by mouth daily.     POTASSIUM CITRATE PO Take by mouth daily.     TURMERIC PO Take by mouth.     HYDROcodone-acetaminophen (NORCO/VICODIN) 5-325 MG tablet Take 1 tablet by mouth every 6 (six) hours as needed for moderate pain.     No facility-administered medications prior to visit.     EXAM:  BP 110/60 (BP Location: Left Arm, Patient Position: Sitting, Cuff Size: Normal)   Pulse 88   Temp 98.9 F (37.2 C) (Oral)   Ht 4\' 10"  (1.473 m)   Wt 117 lb 6.4 oz (53.3 kg)   SpO2 94%   BMI 24.54 kg/m   Body mass index is 24.54 kg/m.  GENERAL: vitals reviewed and listed above, alert, oriented, appears well hydrated and in  no acute distress hard of hearing  HEENT: atraumatic, conjunctiva  clear, no obvious abnormalities on inspection of external nose and ears  NECK: no obvious masses on inspection palpation  LUNGS: clear to auscultation bilaterally, no wheezes, rales or rhonchi, good air movement  kyphosis  CV: HRRR, no clubbing cyanosis or  peripheral edema nl cap refill  AVW:PVXY left of center mid upper abd  area of 5.5x 5 cm soft round   abd was mass flushed after manipulation and no  warmth or skin changes .   MS: moves all extremities without noticeable focal  abnormality PSYCH: pleasant and cooperative, no depression  anxious about  situation Lab Results  Component Value Date   WBC 8.9 04/02/2017   HGB 14.1 04/02/2017   HCT 42.8 04/02/2017   PLT 185.0 04/02/2017   GLUCOSE 99 04/02/2017   CHOL 197 04/02/2017   TRIG 106.0 04/02/2017   HDL 58.60 04/02/2017   LDLDIRECT 135.6 06/13/2009   LDLCALC 118 (H) 04/02/2017   ALT 11 04/02/2017   AST 14 04/02/2017   NA 143 04/02/2017   K 4.0 04/02/2017   CL 107 04/02/2017   CREATININE 0.65 04/02/2017   BUN 14 04/02/2017   CO2 29 04/02/2017   TSH 0.89 10/21/2013   INR 1.0 02/24/2016   BP Readings from Last 3 Encounters:  11/30/20 110/60  05/01/17 102/66  04/02/17 100/72    ASSESSMENT AND PLAN:  Discussed the following assessment and plan:  Abdominal wall lump - Plan: Basic metabolic panel, CBC with Differential/Platelet, Hepatic function panel, Hepatic function panel, CBC with Differential/Platelet, Basic metabolic panel, CT Abdomen Pelvis W Contrast  Abdominal wall swelling - Plan: CT Abdomen Pelvis W Contrast  Abdominal distension (gaseous) - Plan: CT Abdomen Pelvis W Contrast  TOBACCO USER  Decreased hearing of both ears  Osteoporosis without current pathological fracture, unspecified osteoporosis type Plan labs and imaging   -Patient advised to return or notify health care team  if  new concerns arise.  Patient Instructions  Lab tests  today  Will be contacted about appt for an abdominal ct scan to help delineate .   lump  this seems to be  abdominal wall and not attached to  any organ  . Which is reassuring.     Mariann Laster  KRegis Bill M.D.

## 2020-12-01 LAB — CBC WITH DIFFERENTIAL/PLATELET
Basophils Absolute: 0.1 10*3/uL (ref 0.0–0.1)
Basophils Relative: 1.2 % (ref 0.0–3.0)
Eosinophils Absolute: 0.2 10*3/uL (ref 0.0–0.7)
Eosinophils Relative: 3.4 % (ref 0.0–5.0)
HCT: 40.7 % (ref 36.0–46.0)
Hemoglobin: 13.9 g/dL (ref 12.0–15.0)
Lymphocytes Relative: 29.4 % (ref 12.0–46.0)
Lymphs Abs: 2.1 10*3/uL (ref 0.7–4.0)
MCHC: 34.1 g/dL (ref 30.0–36.0)
MCV: 92.1 fl (ref 78.0–100.0)
Monocytes Absolute: 0.5 10*3/uL (ref 0.1–1.0)
Monocytes Relative: 7.3 % (ref 3.0–12.0)
Neutro Abs: 4.3 10*3/uL (ref 1.4–7.7)
Neutrophils Relative %: 58.7 % (ref 43.0–77.0)
Platelets: 182 10*3/uL (ref 150.0–400.0)
RBC: 4.41 Mil/uL (ref 3.87–5.11)
RDW: 13.6 % (ref 11.5–15.5)
WBC: 7.3 10*3/uL (ref 4.0–10.5)

## 2020-12-01 LAB — BASIC METABOLIC PANEL
BUN: 15 mg/dL (ref 6–23)
CO2: 28 mEq/L (ref 19–32)
Calcium: 10 mg/dL (ref 8.4–10.5)
Chloride: 105 mEq/L (ref 96–112)
Creatinine, Ser: 0.77 mg/dL (ref 0.40–1.20)
GFR: 71.37 mL/min (ref >60.00)
Glucose, Bld: 88 mg/dL (ref 70–99)
Potassium: 4 mEq/L (ref 3.5–5.1)
Sodium: 142 mEq/L (ref 135–145)

## 2020-12-01 LAB — HEPATIC FUNCTION PANEL
ALT: 15 U/L (ref 0–35)
AST: 19 U/L (ref 0–37)
Albumin: 4.2 g/dL (ref 3.5–5.2)
Alkaline Phosphatase: 67 U/L (ref 39–117)
Bilirubin, Direct: 0.1 mg/dL (ref 0.0–0.3)
Total Bilirubin: 0.4 mg/dL (ref 0.2–1.2)
Total Protein: 6.4 g/dL (ref 6.0–8.3)

## 2020-12-05 NOTE — Progress Notes (Signed)
Blood  work is all normal  blood count kidney liver   awaiting the ct scan results

## 2020-12-09 ENCOUNTER — Ambulatory Visit
Admission: RE | Admit: 2020-12-09 | Discharge: 2020-12-09 | Disposition: A | Discharge status: Home / Self Care | Payer: Medicare Other | Source: Ambulatory Visit | Attending: Internal Medicine | Admitting: Internal Medicine

## 2020-12-09 DIAGNOSIS — M419 Scoliosis, unspecified: Secondary | ICD-10-CM | POA: Diagnosis not present

## 2020-12-09 DIAGNOSIS — R140 Abdominal distension (gaseous): Secondary | ICD-10-CM

## 2020-12-09 DIAGNOSIS — K429 Umbilical hernia without obstruction or gangrene: Secondary | ICD-10-CM | POA: Diagnosis not present

## 2020-12-09 DIAGNOSIS — R1900 Intra-abdominal and pelvic swelling, mass and lump, unspecified site: Secondary | ICD-10-CM

## 2020-12-09 DIAGNOSIS — K5730 Diverticulosis of large intestine without perforation or abscess without bleeding: Secondary | ICD-10-CM | POA: Diagnosis not present

## 2020-12-09 DIAGNOSIS — R222 Localized swelling, mass and lump, trunk: Secondary | ICD-10-CM

## 2020-12-09 DIAGNOSIS — K439 Ventral hernia without obstruction or gangrene: Secondary | ICD-10-CM | POA: Diagnosis not present

## 2020-12-09 MED ORDER — IOPAMIDOL (ISOVUE-300) INJECTION 61%
100.0000 mL | Freq: Once | INTRAVENOUS | Status: AC | PRN
  Administered 2020-12-09: 100 mL via INTRAVENOUS

## 2020-12-11 NOTE — Progress Notes (Signed)
Ct scan shows  abdominal hernia . As cause of the mass  Please do referral to surgery for consult  opinion. Dx paraumbilical hernia .Marland Kitchen

## 2020-12-12 ENCOUNTER — Other Ambulatory Visit: Payer: Self-pay

## 2020-12-12 DIAGNOSIS — K429 Umbilical hernia without obstruction or gangrene: Secondary | ICD-10-CM

## 2020-12-20 ENCOUNTER — Telehealth: Payer: Self-pay | Admitting: Internal Medicine

## 2020-12-20 NOTE — Telephone Encounter (Signed)
PT called to check the status of her referral was told by [facility] the next available appt was not until aug 8  pt said she can not wait that long she wanted to ask the Dr what to do she does not feel comfortable at night she said she feels like hernia is  going to bust and she has some discomfort she would like call

## 2020-12-23 NOTE — Telephone Encounter (Signed)
Patient called today to let Dr. Regis Bill know that she has an appointment schedule on 08/12. In the mean time she was wanting to know if she should be wearing some kind of support over the hernia area.   She has been getting light headed and it comes and goes and she had diarrhea Wednesday evening.  She is worried to prolong this issue because she has heard about strangulation.  She would like to have a call back today in regards to what she needs to do in the meantime.  Please advise

## 2020-12-26 NOTE — Telephone Encounter (Signed)
I would not think diarrhea is related although stay hydrated check your blood pressure and let us know how you are doing.  Would avoid any heavy lifting straining or things that put pressure on the abdomen. If you get serious unrelenting pain you should seek care at the emergency room.  I do not think  braces or supports help much .

## 2020-12-27 ENCOUNTER — Telehealth: Payer: Self-pay | Admitting: Internal Medicine

## 2020-12-27 NOTE — Telephone Encounter (Signed)
Left a message for the pt to return my call.  

## 2020-12-27 NOTE — Telephone Encounter (Signed)
FYI:  pt called to let Dr. Regis Bill know that she has an appointment with [facility] on January 13, 2021 at 11:00.

## 2020-12-27 NOTE — Telephone Encounter (Signed)
Pt is calling in stating that she is not wanting to do this kind of appointment b/c she just saw Dr. Regis Bill a few weeks ago.

## 2020-12-27 NOTE — Telephone Encounter (Signed)
Left message for patient to call back and schedule Medicare Annual Wellness Visit (AWV) either virtually or in office.   Last AWV 10/21/13 please schedule at anytime with [facility] Nurse Health Advisor 1 or 2   This should be a 45 minute visit.

## 2020-12-28 NOTE — Telephone Encounter (Signed)
Pt informed of the message below and verbalized understanding, Pt stated that she has appt scheduled with [facility] for 01/13/2021. Per pt request CT imaging results have been placed in front office for pickup.

## 2020-12-29 NOTE — Telephone Encounter (Signed)
Documented on spreadsheet 

## 2021-01-13 DIAGNOSIS — K429 Umbilical hernia without obstruction or gangrene: Secondary | ICD-10-CM | POA: Diagnosis not present

## 2021-01-13 DIAGNOSIS — K439 Ventral hernia without obstruction or gangrene: Secondary | ICD-10-CM | POA: Diagnosis not present

## 2021-02-24 DIAGNOSIS — Z1231 Encounter for screening mammogram for malignant neoplasm of breast: Secondary | ICD-10-CM | POA: Diagnosis not present

## 2021-02-24 LAB — HM MAMMOGRAPHY

## 2021-02-27 ENCOUNTER — Encounter: Payer: Self-pay | Admitting: Internal Medicine

## 2021-06-01 ENCOUNTER — Telehealth: Payer: Self-pay

## 2021-06-01 NOTE — Telephone Encounter (Signed)
FYI patient just called into the office. She wanted to let Dr. Regis Bill know she had been riding with a person over the weekend who tested positive for COVID when asked was she experience any symptoms she said she was not I did let her know to please reach out

## 2021-07-03 ENCOUNTER — Telehealth: Payer: Self-pay | Admitting: Internal Medicine

## 2021-07-03 NOTE — Telephone Encounter (Signed)
Left message for patient to call back and schedule Medicare Annual Wellness Visit (AWV) either virtually or in office. Left  my Megan Lucas number [phone]   Last AWV ;10/21/13  please schedule at anytime with [facility] Nurse Health Advisor 1 or 2   This should be a 45 minute visit.

## 2021-07-10 ENCOUNTER — Ambulatory Visit (INDEPENDENT_AMBULATORY_CARE_PROVIDER_SITE_OTHER): Payer: Medicare Other

## 2021-07-10 VITALS — BP 110/62 | HR 68 | Temp 98.6°F | Ht <= 58 in | Wt 117.7 lb

## 2021-07-10 DIAGNOSIS — Z0000 Encounter for general adult medical examination without abnormal findings: Secondary | ICD-10-CM | POA: Diagnosis not present

## 2021-07-10 NOTE — Patient Instructions (Addendum)
[name] , Thank you for taking time to come for your Medicare Wellness Visit. I appreciate your ongoing commitment to your health goals. Please review the following plan we discussed and let me know if I can assist you in the future.   These are the goals we discussed:  Goals       Increase physical activity (pt-stated)      Maintain exercise and good health.        This is a list of the screening recommended for you and due dates:  Health Maintenance  Topic Date Due   COVID-19 Vaccine (4 - Booster for Pfizer series) 07/26/2021*   Zoster (Shingles) Vaccine (1 of 2) 10/07/2021*   Tetanus Vaccine  07/10/2022*   Mammogram  02/24/2022   Pneumonia Vaccine  Completed   Flu Shot  Completed   DEXA scan (bone density measurement)  Completed   HPV Vaccine  Aged Out  *Topic was postponed. The date shown is not the original due date.     Advanced directives: Yes Patient will bring copy  Conditions/risks identified: None  Next appointment: Follow up in one year for your annual wellness visit    Preventive Care 65 Years and Older, Female Preventive care refers to lifestyle choices and visits with your health care provider that can promote health and wellness. What does preventive care include? A yearly physical exam. This is also called an annual well check. Dental exams once or twice a year. Routine eye exams. Ask your health care provider how often you should have your eyes checked. Personal lifestyle choices, including: Daily care of your teeth and gums. Regular physical activity. Eating a healthy diet. Avoiding tobacco and drug use. Limiting alcohol use. Practicing safe sex. Taking low-dose aspirin every day. Taking vitamin and mineral supplements as recommended by your health care provider. What happens during an annual well check? The services and screenings done by your health care provider during your annual well check will depend on your age, overall health, lifestyle  risk factors, and family history of disease. Counseling  Your health care provider may ask you questions about your: Alcohol use. Tobacco use. Drug use. Emotional well-being. Home and relationship well-being. Sexual activity. Eating habits. History of falls. Memory and ability to understand (cognition). Work and work Statistician. Reproductive health. Screening  You may have the following tests or measurements: Height, weight, and BMI. Blood pressure. Lipid and cholesterol levels. These may be checked every 5 years, or more frequently if you are over 84 years old. Skin check. Lung cancer screening. You may have this screening every year starting at age 84 if you have a 30-pack-year history of smoking and currently smoke or have quit within the past 15 years. Fecal occult blood test (FOBT) of the stool. You may have this test every year starting at age 84. Flexible sigmoidoscopy or colonoscopy. You may have a sigmoidoscopy every 5 years or a colonoscopy every 10 years starting at age 84. Hepatitis C blood test. Hepatitis B blood test. Sexually transmitted disease (STD) testing. Diabetes screening. This is done by checking your blood sugar (glucose) after you have not eaten for a while (fasting). You may have this done every 1-3 years. Bone density scan. This is done to screen for osteoporosis. You may have this done starting at age 84. Mammogram. This may be done every 1-2 years. Talk to your health care provider about how often you should have regular mammograms. Talk with your health care provider about your  test results, treatment options, and if necessary, the need for more tests. Vaccines  Your health care provider may recommend certain vaccines, such as: Influenza vaccine. This is recommended every year. Tetanus, diphtheria, and acellular pertussis (Tdap, Td) vaccine. You may need a Td booster every 10 years. Zoster vaccine. You may need this after age 84. Pneumococcal  13-valent conjugate (PCV13) vaccine. One dose is recommended after age 84. Pneumococcal polysaccharide (PPSV23) vaccine. One dose is recommended after age 84. Talk to your health care provider about which screenings and vaccines you need and how often you need them. This information is not intended to replace advice given to you by your health care provider. Make sure you discuss any questions you have with your health care provider. Document Released: 06/17/2015 Document Revised: 02/08/2016 Document Reviewed: 03/22/2015 Elsevier Interactive Patient Education  2017 [place] Prevention in the Home Falls can cause injuries. They can happen to people of all ages. There are many things you can do to make your home safe and to help prevent falls. What can I do on the outside of my home? Regularly fix the edges of walkways and driveways and fix any cracks. Remove anything that might make you trip as you walk through a door, such as a raised step or threshold. Trim any bushes or trees on the path to your home. Use bright outdoor lighting. Clear any walking paths of anything that might make someone trip, such as rocks or tools. Regularly check to see if handrails are loose or broken. Make sure that both sides of any steps have handrails. Any raised decks and porches should have guardrails on the edges. Have any leaves, snow, or ice cleared regularly. Use sand or salt on walking paths during winter. Clean up any spills in your garage right away. This includes oil or grease spills. What can I do in the bathroom? Use night lights. Install grab bars by the toilet and in the tub and shower. Do not use towel bars as grab bars. Use non-skid mats or decals in the tub or shower. If you need to sit down in the shower, use a plastic, non-slip stool. Keep the floor dry. Clean up any water that spills on the floor as soon as it happens. Remove soap buildup in the tub or shower regularly. Attach  bath mats securely with double-sided non-slip rug tape. Do not have throw rugs and other things on the floor that can make you trip. What can I do in the bedroom? Use night lights. Make sure that you have a light by your bed that is easy to reach. Do not use any sheets or blankets that are too big for your bed. They should not hang down onto the floor. Have a firm chair that has side arms. You can use this for support while you get dressed. Do not have throw rugs and other things on the floor that can make you trip. What can I do in the kitchen? Clean up any spills right away. Avoid walking on wet floors. Keep items that you use a lot in easy-to-reach places. If you need to reach something above you, use a strong step stool that has a grab bar. Keep electrical cords out of the way. Do not use floor polish or wax that makes floors slippery. If you must use wax, use non-skid floor wax. Do not have throw rugs and other things on the floor that can make you trip. What can I do with my  stairs? Do not leave any items on the stairs. Make sure that there are handrails on both sides of the stairs and use them. Fix handrails that are broken or loose. Make sure that handrails are as long as the stairways. Check any carpeting to make sure that it is firmly attached to the stairs. Fix any carpet that is loose or worn. Avoid having throw rugs at the top or bottom of the stairs. If you do have throw rugs, attach them to the floor with carpet tape. Make sure that you have a light switch at the top of the stairs and the bottom of the stairs. If you do not have them, ask someone to add them for you. What else can I do to help prevent falls? Wear shoes that: Do not have high heels. Have rubber bottoms. Are comfortable and fit you well. Are closed at the toe. Do not wear sandals. If you use a stepladder: Make sure that it is fully opened. Do not climb a closed stepladder. Make sure that both sides of the  stepladder are locked into place. Ask someone to hold it for you, if possible. Clearly mark and make sure that you can see: Any grab bars or handrails. First and last steps. Where the edge of each step is. Use tools that help you move around (mobility aids) if they are needed. These include: Canes. Walkers. Scooters. Crutches. Turn on the lights when you go into a dark area. Replace any light bulbs as soon as they burn out. Set up your furniture so you have a clear path. Avoid moving your furniture around. If any of your floors are uneven, fix them. If there are any pets around you, be aware of where they are. Review your medicines with your doctor. Some medicines can make you feel dizzy. This can increase your chance of falling. Ask your doctor what other things that you can do to help prevent falls. This information is not intended to replace advice given to you by your health care provider. Make sure you discuss any questions you have with your health care provider. Document Released: 03/17/2009 Document Revised: 10/27/2015 Document Reviewed: 06/25/2014 Elsevier Interactive Patient Education  2017 [employer].

## 2021-07-10 NOTE — Progress Notes (Signed)
Subjective:   Megan Lucas is a 86 y.o. female who presents for Medicare Annual (Subsequent) preventive examination.  Review of Systems     Cardiac Risk Factors include: advanced age (>21men, >51 women);smoking/ tobacco exposure     Objective:    Today's Vitals   07/10/21 0956  BP: 110/62  Pulse: 68  Temp: 98.6 F (37 C)  TempSrc: Oral  SpO2: 96%  Weight: 117 lb 11.2 oz (53.4 kg)  Height: 4\' 10"  (1.473 m)   Body mass index is 24.6 kg/m.  Advanced Directives 07/10/2021 08/13/2016 11/24/2013  Does Patient Have a Medical Advance Directive? Yes Yes Patient has advance directive, copy not in chart  Type of Advance Directive [place];Living will [place];Living will Living will;Healthcare Power of Attorney  Does patient want to make changes to medical advance directive? No - Patient declined - -  Copy of [place] in Chart? No - copy requested No - copy requested -    Current Medications (verified) Outpatient Encounter Medications as of 07/10/2021  Medication Sig   aspirin EC 81 MG tablet Take 81 mg by mouth daily.   Calcium Carbonate-Vit D-Min (CALCIUM 1200 PO) Take by mouth 2 (two) times daily.   Ginger, Zingiber officinalis, (GINGER ROOT PO) Take by mouth.   glucosamine-chondroitin 500-400 MG tablet Take 1 tablet by mouth 2 (two) times daily.   LUTEIN PO Take 1 tablet by mouth daily.   Multiple Vitamin (MULTIVITAMIN) tablet Take 1 tablet by mouth daily.   Omega-3 Fatty Acids (FISH OIL PO) Take by mouth daily.   POTASSIUM CITRATE PO Take by mouth daily.   TURMERIC PO Take by mouth.   No facility-administered encounter medications on file as of 07/10/2021.    Allergies (verified) Patient has no known allergies.   History: Past Medical History:  Diagnosis Date   Cataract    Chronic kidney disease    kidney stone left 2018   Colon polyp, hyperplastic    Cough, persistent 09/05/2012   Suspect COPD symptoms rule  out infection other.    Diverticulosis    Environmental allergies    History of arm fracture    Left   Osteoarthritis    Osteoporosis    DEXA 2007-3.2 spine -1.6 hip   Past Surgical History:  Procedure Laterality Date   EXTRACORPOREAL SHOCK WAVE LITHOTRIPSY Left 08/13/2016   Procedure: LEFT EXTRACORPOREAL SHOCK WAVE LITHOTRIPSY (ESWL);  Surgeon: Irine Seal, MD;  Location: [facility];  Service: Urology;  Laterality: Left;   NO PAST SURGERIES     Family History  Problem Relation Age of Onset   Breast cancer Unknown    Lung disease Unknown    Stroke Father 73   Prostate cancer Father    Other Mother        Died MVA sister died MVA age 78   Colon cancer Neg Hx    Social History   Socioeconomic History   Marital status: Divorced    Spouse name: Not on file   Number of children: Not on file   Years of education: Not on file   Highest education level: Not on file  Occupational History   Not on file  Tobacco Use   Smoking status: Every Day    Packs/day: 0.50    Types: Cigarettes   Smokeless tobacco: Never   Tobacco comments:    smokes only in evening - states she does not inhale (04/02/17)  Substance and Sexual Activity  Alcohol use: No   Drug use: No   Sexual activity: Not on file  Other Topics Concern   Not on file  Social History Narrative   Childbirth x2 g2 p2    Negative alcohol tobacco times many years 8 to 10 in the evening   Does do regular exercise seat belt use.   Divorced widowed Public house manager   No pets   Social Determinants of Health   Financial Resource Strain: Low Risk    Difficulty of Paying Living Expenses: Not hard at all  Food Insecurity: No Food Insecurity   Worried About Charity fundraiser in the Last Year: Never true   Arboriculturist in the Last Year: Never true  Transportation Needs: No Transportation Needs   Lack of Transportation (Medical): No   Lack of Transportation (Non-Medical): No  Physical Activity: Insufficiently  Active   Days of Exercise per Week: 2 days   Minutes of Exercise per Session: 60 min  Stress: No Stress Concern Present   Feeling of Stress : Not at all  Social Connections: Moderately Integrated   Frequency of Communication with Friends and Family: More than three times a week   Frequency of Social Gatherings with Friends and Family: More than three times a week   Attends Religious Services: More than 4 times per year   Active Member of [employer] or Organizations: Yes   Attends Music therapist: More than 4 times per year   Marital Status: Divorced    Tobacco Counseling Ready to quit: No Counseling given: Yes Tobacco comments: smokes only in evening - states she does not inhale (04/02/17)   Clinical Intake:  Pre-visit preparation completed: Yes  Pain : No/denies pain     BMI - recorded: 24.52 Nutritional Status: BMI of 19-24  Normal Nutritional Risks: None Diabetes: No  How often do you need to have someone help you when you read instructions, pamphlets, or other written materials from your doctor or pharmacy?: 1 - Never  Diabetic? No  Interpreter Needed?: No  Information entered by :: Rolene Arbour LPN   Activities of Daily Living In your present state of health, do you have any difficulty performing the following activities: 07/10/2021  Hearing? N  Vision? N  Difficulty concentrating or making decisions? N  Walking or climbing stairs? N  Dressing or bathing? N  Doing errands, shopping? N  Preparing Food and eating ? N  Using the Toilet? N  In the past six months, have you accidently leaked urine? N  Do you have problems with loss of bowel control? N  Managing your Medications? N  Managing your Finances? N  Housekeeping or managing your Housekeeping? N  Some recent data might be hidden    Patient Care Team: Panosh, Standley Brooking, MD as PCP - General  Indicate any recent Medical Services you may have received from other than [facility] providers in the past  year (date may be approximate).     Assessment:   This is a routine wellness examination for Megan Lucas.  Hearing/Vision screen Hearing Screening - Comments:: Wears hearing aids Vision Screening - Comments:: Wears reading glasses. Followed by Coralie Keens  Dietary issues and exercise activities discussed: Current Exercise Habits: Home exercise routine, Type of exercise: walking, Time (Minutes): 60, Frequency (Times/Week): 2, Weekly Exercise (Minutes/Week): 120, Intensity: Moderate, Exercise limited by: None identified   Goals Addressed               This Visit's  Progress     Increase physical activity (pt-stated)        Maintain exercise and good health.       Depression Screen PHQ 2/9 Scores 07/10/2021 11/30/2020 04/02/2017 10/21/2013 09/05/2012  PHQ - 2 Score 0 1 0 0 0  PHQ- 9 Score - 1 - - -    Fall Risk Fall Risk  07/10/2021 11/30/2020 04/02/2017 10/21/2013 09/05/2012  Falls in the past year? 0 1 No No No  Number falls in past yr: 0 1 - - -  Injury with Fall? 0 0 - - -    FALL RISK PREVENTION PERTAINING TO THE HOME:  Any stairs in or around the home? Yes  If so, are there any without handrails? Yes Home free of loose throw rugs in walkways, pet beds, electrical cords, etc? Yes  Adequate lighting in your home to reduce risk of falls? Yes   ASSISTIVE DEVICES UTILIZED TO PREVENT FALLS:  Life alert? No  Use of a cane, walker or w/c? No  Grab bars in the bathroom? No  Shower chair or bench in shower? No  Elevated toilet seat or a handicapped toilet? No   TIMED UP AND GO:  Was the test performed? Yes .  Length of time to ambulate 10 feet: 5  sec.   Gait steady and fast without use of assistive device  Cognitive Function:    Immunizations Immunization History  Administered Date(s) Administered   Fluad Quad(high Dose 65+) 04/06/2021   Influenza Whole 03/07/2009   PFIZER(Purple Top)SARS-COV-2 Vaccination 07/09/2019, 08/03/2019, 04/02/2020   Pneumococcal  Conjugate-13 10/21/2013   Pneumococcal Polysaccharide-23 03/07/2009   Td 09/25/2004    TDAP status: Due, Education has been provided regarding the importance of this vaccine. Advised may receive this vaccine at local pharmacy or Health Dept. Aware to provide a copy of the vaccination record if obtained from local pharmacy or Health Dept. Verbalized acceptance and understanding.  Flu Vaccine status: Up to date  Pneumococcal vaccine status: Up to date  Covid-19 vaccine status: Completed vaccines  Qualifies for Shingles Vaccine? Yes   Zostavax completed No   Shingrix Completed?: No.    Education has been provided regarding the importance of this vaccine. Patient has been advised to call insurance company to determine out of pocket expense if they have not yet received this vaccine. Advised may also receive vaccine at local pharmacy or Health Dept. Verbalized acceptance and understanding.  Screening Tests Health Maintenance  Topic Date Due   COVID-19 Vaccine (4 - Booster for Pfizer series) 07/26/2021 (Originally 05/28/2020)   Zoster Vaccines- Shingrix (1 of 2) 10/07/2021 (Originally 07/28/1954)   TETANUS/TDAP  07/10/2022 (Originally 09/26/2014)   MAMMOGRAM  02/24/2022   Pneumonia Vaccine 42+ Years old  Completed   INFLUENZA VACCINE  Completed   DEXA SCAN  Completed   HPV VACCINES  Aged Out    Health Maintenance  There are no preventive care reminders to display for this patient.   Colorectal cancer screening: No longer required.   Mammogram status: No longer required due to )).  Bone Density status: Completed Yes. Results reflect: Bone density results: OSTEOPOROSIS. Repeat every 10  years.  Additional Screening:   Vision Screening: Recommended annual ophthalmology exams for early detection of glaucoma and other disorders of the eye. Is the patient up to date with their annual eye exam?  Yes  Who is the provider or what is the name of the office in which the patient attends  annual eye exams? [facility]  If pt is not established with a provider, would they like to be referred to a provider to establish care? No .   Dental Screening: Recommended annual dental exams for proper oral hygiene  Community Resource Referral / Chronic Care Management: CRR required this visit?  No   CCM required this visit?  No      Plan:     I have personally reviewed and noted the following in the patients chart:   Medical and social history Use of alcohol, tobacco or illicit drugs  Current medications and supplements including opioid prescriptions.  Functional ability and status Nutritional status Physical activity Advanced directives List of other physicians Hospitalizations, surgeries, and ER visits in previous 12 months Vitals Screenings to include cognitive, depression, and falls Referrals and appointments  In addition, I have reviewed and discussed with patient certain preventive protocols, quality metrics, and best practice recommendations. A written personalized care plan for preventive services as well as general preventive health recommendations were provided to patient.     Criselda Peaches, LPN   07/11/6145   Nurse Notes: None

## 2021-07-19 DIAGNOSIS — H40013 Open angle with borderline findings, low risk, bilateral: Secondary | ICD-10-CM | POA: Diagnosis not present

## 2021-07-19 DIAGNOSIS — H524 Presbyopia: Secondary | ICD-10-CM | POA: Diagnosis not present

## 2021-08-02 ENCOUNTER — Ambulatory Visit (INDEPENDENT_AMBULATORY_CARE_PROVIDER_SITE_OTHER): Payer: Medicare Other | Admitting: Internal Medicine

## 2021-08-02 ENCOUNTER — Encounter: Payer: Self-pay | Admitting: Internal Medicine

## 2021-08-02 VITALS — BP 118/87 | HR 88 | Temp 98.8°F

## 2021-08-02 DIAGNOSIS — L989 Disorder of the skin and subcutaneous tissue, unspecified: Secondary | ICD-10-CM

## 2021-08-02 MED ORDER — DOXYCYCLINE HYCLATE 100 MG PO TABS: 100.0000 mg | ORAL_TABLET | Freq: Two times a day (BID) | ORAL | 0 refills | Status: AC

## 2021-08-02 NOTE — Progress Notes (Signed)
? ? ? ?Established Patient Office Visit ? ? ? ? ?This visit occurred during the SARS-CoV-2 public health emergency.  Safety protocols were in place, including screening questions prior to the visit, additional usage of staff PPE, and extensive cleaning of exam room while observing appropriate contact time as indicated for disinfecting solutions.  ? ? ?CC/Reason for Visit: Bump on left hand ? ?HPI: Megan Lucas is a 86 y.o. female who is coming in today for the above mentioned reasons. About 1 month ago suffered a small laceration over the dorsum of her left hand after bumping it against a kitchen cabinet. 1 week ago noticed a bump over the dorsum of her hand, surrounded by erythema that is quite painful. Denies fevers. ? ? ?Past Medical/Surgical History: ?Past Medical History:  ?Diagnosis Date  ? Cataract   ? Chronic kidney disease   ? kidney stone left 2018  ? Colon polyp, hyperplastic   ? Cough, persistent 09/05/2012  ? Suspect COPD symptoms rule out infection other.   ? Diverticulosis   ? Environmental allergies   ? History of arm fracture   ? Left  ? Osteoarthritis   ? Osteoporosis   ? DEXA 2007-3.2 spine -1.6 hip  ? ? ?Past Surgical History:  ?Procedure Laterality Date  ? EXTRACORPOREAL SHOCK WAVE LITHOTRIPSY Left 08/13/2016  ? Procedure: LEFT EXTRACORPOREAL SHOCK WAVE LITHOTRIPSY (ESWL);  Surgeon: Irine Seal, MD;  Location: [facility];  Service: Urology;  Laterality: Left;  ? NO PAST SURGERIES    ? ? ?Social History: ? reports that she has been smoking cigarettes. She has been smoking an average of .5 packs per day. She has never used smokeless tobacco. She reports that she does not drink alcohol and does not use drugs. ? ?Allergies: ?No Known Allergies ? ?Family History:  ?Family History  ?Problem Relation Age of Onset  ? Breast cancer Unknown   ? Lung disease Unknown   ? Stroke Father 57  ? Prostate cancer Father   ? Other Mother   ?     Died MVA sister died MVA age 16  ? Colon cancer Neg Hx   ? ? ? ?Current  Outpatient Medications:  ?  aspirin EC 81 MG tablet, Take 81 mg by mouth daily., Disp: , Rfl:  ?  Calcium Carbonate-Vit D-Min (CALCIUM 1200 PO), Take by mouth 2 (two) times daily., Disp: , Rfl:  ?  doxycycline (VIBRA-TABS) 100 MG tablet, Take 1 tablet (100 mg total) by mouth 2 (two) times daily for 7 days., Disp: 14 tablet, Rfl: 0 ?  Ginger, Zingiber officinalis, (GINGER ROOT PO), Take by mouth., Disp: , Rfl:  ?  glucosamine-chondroitin 500-400 MG tablet, Take 1 tablet by mouth 2 (two) times daily., Disp: , Rfl:  ?  LUTEIN PO, Take 1 tablet by mouth daily., Disp: , Rfl:  ?  Multiple Vitamin (MULTIVITAMIN) tablet, Take 1 tablet by mouth daily., Disp: , Rfl:  ?  Omega-3 Fatty Acids (FISH OIL PO), Take by mouth daily., Disp: , Rfl:  ?  POTASSIUM CITRATE PO, Take by mouth daily., Disp: , Rfl:  ?  TURMERIC PO, Take by mouth., Disp: , Rfl:  ? ?Review of Systems:  ?Constitutional: Denies fever, chills, diaphoresis, appetite change and fatigue.  ?HEENT: Denies photophobia, eye pain, redness, hearing loss, ear pain, congestion, sore throat, rhinorrhea, sneezing, mouth sores, trouble swallowing, neck pain, neck stiffness and tinnitus.   ?Respiratory: Denies SOB, DOE, cough, chest tightness,  and wheezing.   ?Cardiovascular: Denies chest pain,  palpitations and leg swelling.  ?Gastrointestinal: Denies nausea, vomiting, abdominal pain, diarrhea, constipation, blood in stool and abdominal distention.  ?Genitourinary: Denies dysuria, urgency, frequency, hematuria, flank pain and difficulty urinating.  ?Endocrine: Denies: hot or cold intolerance, sweats, changes in hair or nails, polyuria, polydipsia. ?Musculoskeletal: Denies myalgias, back pain, joint swelling, arthralgias and gait problem.  ?Skin: Denies pallor, rash.  ?Neurological: Denies dizziness, seizures, syncope, weakness, light-headedness, numbness and headaches.  ?Hematological: Denies adenopathy. Easy bruising, personal or family bleeding history   ?Psychiatric/Behavioral: Denies suicidal ideation, mood changes, confusion, nervousness, sleep disturbance and agitation ? ? ? ?Physical Exam: ?Vitals:  ? 08/02/21 1309  ?BP: 118/87  ?Pulse: 88  ?Temp: 98.8 ?F (37.1 ?C)  ?TempSrc: Oral  ?SpO2: 97%  ? ? ?There is no height or weight on file to calculate BMI. ? ? ?Constitutional: NAD, calm, comfortable ?Eyes: PERRL, lids and conjunctivae normal ?ENMT: Mucous membranes are moist.  ?Skin: ? ? ? ? ? ?  ? ? ?Impression and Plan: ? ?Skin lesion of hand  ?- Plan: doxycycline (VIBRA-TABS) 100 MG tablet ?-Unclear etiology, but erythema makes me suspicious for localized abscess. Maybe a localized hematoma? Area is hard and not fluctuant. ?-Will treat with doxy and warm compresses. Consider referral to hand specialist if no improvement after abx therapy. ? ?Time spent: 22 minutes ? ? ?Patient Instructions  ?-Nice seeing you today!! ? ?-Start doxycyline 100 mg twice daily for 7 days. ? ?-Warm compresses 3 times a day for 10 minutes at a time. ? ?-If not better in 10-14 days, please reach out to [place] again. ? ? ? ?Lelon Frohlich, MD ?[facility] ? ? ?

## 2021-08-02 NOTE — Patient Instructions (Signed)
-  Nice seeing you today!! ? ?-Start doxycyline 100 mg twice daily for 7 days. ? ?-Warm compresses 3 times a day for 10 minutes at a time. ? ?-If not better in 10-14 days, please reach out to [place] again. ?

## 2021-08-10 ENCOUNTER — Telehealth: Payer: Self-pay | Admitting: Internal Medicine

## 2021-08-10 DIAGNOSIS — L989 Disorder of the skin and subcutaneous tissue, unspecified: Secondary | ICD-10-CM

## 2021-08-10 NOTE — Telephone Encounter (Signed)
Referral placed.

## 2021-08-10 NOTE — Telephone Encounter (Signed)
Pt call and stated her hand have got worse and want a call back because she want someone to take care it soon. ?

## 2021-08-10 NOTE — Telephone Encounter (Signed)
Spoke with patient and patient stated hand has gotten worst and was told that if hand got worst to call the office so that a referral would be placed. Patient will be in and out today and stated ok to leave message   ?

## 2021-08-10 NOTE — Telephone Encounter (Signed)
Evaluation was via dr Jerilee Hoh  .  And the plan was to refer if not improved    Please refer to hand surgeon specialist. ( Send  ov note and evaluation)

## 2021-08-15 ENCOUNTER — Encounter: Payer: Self-pay | Admitting: Orthopaedic Surgery

## 2021-08-15 ENCOUNTER — Ambulatory Visit (INDEPENDENT_AMBULATORY_CARE_PROVIDER_SITE_OTHER): Payer: Medicare Other

## 2021-08-15 ENCOUNTER — Other Ambulatory Visit: Payer: Self-pay

## 2021-08-15 ENCOUNTER — Ambulatory Visit: Payer: Medicare Other | Admitting: Orthopaedic Surgery

## 2021-08-15 ENCOUNTER — Other Ambulatory Visit: Payer: Self-pay | Admitting: Orthopaedic Surgery

## 2021-08-15 ENCOUNTER — Telehealth: Payer: Self-pay | Admitting: Orthopaedic Surgery

## 2021-08-15 DIAGNOSIS — M25542 Pain in joints of left hand: Secondary | ICD-10-CM | POA: Diagnosis not present

## 2021-08-15 DIAGNOSIS — M899 Disorder of bone, unspecified: Secondary | ICD-10-CM

## 2021-08-15 DIAGNOSIS — C44629 Squamous cell carcinoma of skin of left upper limb, including shoulder: Secondary | ICD-10-CM | POA: Diagnosis not present

## 2021-08-15 NOTE — Telephone Encounter (Signed)
Left voice mail

## 2021-08-15 NOTE — Telephone Encounter (Signed)
Rosann Auerbach from [facility] called with results from biopsy. She is asking for a call back. Please call Rosann Auerbach about this matter at [phone] extension [phone]  ?

## 2021-08-15 NOTE — Progress Notes (Signed)
1 ? ?Office Visit Note ?  ?Patient: Megan Lucas           ?Date of Birth: December 29, 1935           ?MRN: [id number] ?Visit Date: 08/15/2021 ?             ?Requested by: Isaac Bliss, Rayford Halsted, MD ?[address] ?[place],  [place] [address] ?PCP: Burnis Medin, MD ? ? ?Assessment & Plan: ?Visit Diagnoses:  ?1. Lesion of bone of left hand   ? ? ?Plan: I was able to squeeze the mass and expressed chalky white material as well as a firm white mass from the area.  I used a 15 blade to excise it.  I did not see any penetration deeper than the subcu level.  The wound was covered with wet-to-dry dressings which she will do twice a day.  We will send the mass for tissue culture and pathology.  Recheck in 1 week. ? ?Follow-Up Instructions: Return in about 1 week (around 08/22/2021).  ? ?Orders:  ?Orders Placed This Encounter  ?Procedures  ? XR Hand Complete Left  ? ?No orders of the defined types were placed in this encounter. ? ? ? ? Procedures: ?No procedures performed ? ? ?Clinical Data: ?No additional findings. ? ? ?Subjective: ?Chief Complaint  ?Patient presents with  ? Left Hand - Pain  ? ? ?HPI ? ?Megan Lucas is a 86 year old female here for evaluation of a mass of the left hand.  She noticed the mass about 3 weeks ago.  She can only recall trauma to her left hand from about 4 months ago when she hit her hand on the counter but she does not recall any masses developing after the injury.  She denies any constitutional symptoms.  The mass is enlarging based on the patient's description.  Her PCP placed her on a week of antibiotics.  There is only pain when it is touched. ? ?Review of Systems  ?Constitutional: Negative.   ?HENT: Negative.    ?Eyes: Negative.   ?Respiratory: Negative.    ?Cardiovascular: Negative.   ?Endocrine: Negative.   ?Musculoskeletal: Negative.   ?Neurological: Negative.   ?Hematological: Negative.   ?Psychiatric/Behavioral: Negative.    ?All other systems reviewed and are  negative. ? ? ?Objective: ?Vital Signs: There were no vitals taken for this visit. ? ?Physical Exam ?Vitals and nursing note reviewed.  ?Constitutional:   ?   Appearance: She is well-developed.  ?HENT:  ?   Head: Normocephalic and atraumatic.  ?Pulmonary:  ?   Effort: Pulmonary effort is normal.  ?Abdominal:  ?   Palpations: Abdomen is soft.  ?Musculoskeletal:  ?   Cervical back: Neck supple.  ?Skin: ?   General: Skin is warm.  ?   Capillary Refill: Capillary refill takes less than 2 seconds.  ?Neurological:  ?   Mental Status: She is alert and oriented to person, place, and time.  ?Psychiatric:     ?   Behavior: Behavior normal.     ?   Thought Content: Thought content normal.     ?   Judgment: Judgment normal.  ? ? ?Ortho Exam ? ?Examination of the left hand shows a 2 cm mass on the dorsal aspect of the left hand over the third metacarpal.  There is no pus or fluctuance.  The mass is firm to palpation.  It is not pulsatile.  It feels solid in nature. ? ?Specialty Comments:  ?No specialty comments available. ? ?Imaging: ?  No results found. ? ? ?PMFS History: ?Patient Active Problem List  ? Diagnosis Date Noted  ? Lesion of bone of left hand 08/15/2021  ? Gall bladder polyp 02/20/2016  ? Abnormal liver ultrasound 02/20/2016  ? Pigmented skin lesion of uncertain nature 56/43/3295  ? Abnormal chest x-ray 10/21/2013  ? History of rectal bleeding 10/21/2013  ? Decreased hearing 09/05/2012  ? Visit for preventive health examination 09/05/2012  ? TOBACCO USER 06/13/2009  ? COLONIC POLYPS, HX OF 04/04/2009  ? ABDOMINAL BLOATING 02/22/2009  ? HYPERLIPIDEMIA 08/26/2007  ? NEOPLASM, SKIN, UNCERTAIN BEHAVIOR 18/84/1660  ? SEBORRHEIC KERATOSIS 08/15/2007  ? OSTEOARTHRITIS 01/22/2007  ? OSTEOPOROSIS 01/22/2007  ? ?Past Medical History:  ?Diagnosis Date  ? Cataract   ? Chronic kidney disease   ? kidney stone left 2018  ? Colon polyp, hyperplastic   ? Cough, persistent 09/05/2012  ? Suspect COPD symptoms rule out infection other.   ?  Diverticulosis   ? Environmental allergies   ? History of arm fracture   ? Left  ? Osteoarthritis   ? Osteoporosis   ? DEXA 2007-3.2 spine -1.6 hip  ?  ?Family History  ?Problem Relation Age of Onset  ? Breast cancer Unknown   ? Lung disease Unknown   ? Stroke Father 58  ? Prostate cancer Father   ? Other Mother   ?     Died MVA sister died MVA age 40  ? Colon cancer Neg Hx   ?  ?Past Surgical History:  ?Procedure Laterality Date  ? EXTRACORPOREAL SHOCK WAVE LITHOTRIPSY Left 08/13/2016  ? Procedure: LEFT EXTRACORPOREAL SHOCK WAVE LITHOTRIPSY (ESWL);  Surgeon: Irine Seal, MD;  Location: [facility];  Service: Urology;  Laterality: Left;  ? NO PAST SURGERIES    ? ?Social History  ? ?Occupational History  ? Not on file  ?Tobacco Use  ? Smoking status: Every Day  ?  Packs/day: 0.50  ?  Types: Cigarettes  ? Smokeless tobacco: Never  ? Tobacco comments:  ?  smokes only in evening - states she does not inhale (04/02/17)  ?Substance and Sexual Activity  ? Alcohol use: No  ? Drug use: No  ? Sexual activity: Not on file  ? ? ? ? ? ? ?

## 2021-08-21 ENCOUNTER — Other Ambulatory Visit: Payer: Self-pay

## 2021-08-21 DIAGNOSIS — M899 Disorder of bone, unspecified: Secondary | ICD-10-CM

## 2021-08-21 NOTE — Progress Notes (Signed)
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

## 2021-08-22 ENCOUNTER — Other Ambulatory Visit: Payer: Self-pay

## 2021-08-22 ENCOUNTER — Ambulatory Visit (INDEPENDENT_AMBULATORY_CARE_PROVIDER_SITE_OTHER): Payer: Medicare Other | Admitting: Orthopaedic Surgery

## 2021-08-22 DIAGNOSIS — C44629 Squamous cell carcinoma of skin of left upper limb, including shoulder: Secondary | ICD-10-CM | POA: Diagnosis not present

## 2021-08-22 NOTE — Progress Notes (Signed)
? ?Office Visit Note ?  ?Patient: Megan Lucas           ?Date of Birth: April 29, 1936           ?MRN: [id number] ?Visit Date: 08/22/2021 ?             ?Requested by: Burnis Medin, MD ?[address] ?[place],  [place] [id number] ?PCP: Burnis Medin, MD ? ? ?Assessment & Plan: ?Visit Diagnoses:  ?1. Squamous cell cancer of skin of left hand   ? ? ?Plan: Megan Lucas returns today for follow-up of the left hand mass.  Pathology showed well-differentiated squamous cell carcinoma with unclear margins. ? ?These findings were reviewed with Megan Lucas and we have already made an urgent referral to dermatology for definitive treatment of this.  Until then she should continue to cover the lesion with a dry Band-Aid. ? ?Follow-Up Instructions: No follow-ups on file.  ? ?Orders:  ?No orders of the defined types were placed in this encounter. ? ?No orders of the defined types were placed in this encounter. ? ? ? ? Procedures: ?No procedures performed ? ? ?Clinical Data: ?No additional findings. ? ? ?Subjective: ?Chief Complaint  ?Patient presents with  ? Left Hand - Follow-up, Wound Check  ? ? ?HPI ? ?Review of Systems  ?Constitutional: Negative.   ?HENT: Negative.    ?Eyes: Negative.   ?Respiratory: Negative.    ?Cardiovascular: Negative.   ?Endocrine: Negative.   ?Musculoskeletal: Negative.   ?Neurological: Negative.   ?Hematological: Negative.   ?Psychiatric/Behavioral: Negative.    ?All other systems reviewed and are negative. ? ? ?Objective: ?Vital Signs: There were no vitals taken for this visit. ? ?Physical Exam ?Vitals and nursing note reviewed.  ?Constitutional:   ?   Appearance: She is well-developed.  ?HENT:  ?   Head: Normocephalic and atraumatic.  ?Pulmonary:  ?   Effort: Pulmonary effort is normal.  ?Abdominal:  ?   Palpations: Abdomen is soft.  ?Musculoskeletal:  ?   Cervical back: Neck supple.  ?Skin: ?   General: Skin is warm.  ?   Capillary Refill: Capillary refill takes less than 2 seconds.  ?Neurological:  ?    Mental Status: She is alert and oriented to person, place, and time.  ?Psychiatric:     ?   Behavior: Behavior normal.     ?   Thought Content: Thought content normal.     ?   Judgment: Judgment normal.  ? ? ?Ortho Exam ? ?Specialty Comments:  ?No specialty comments available. ? ?Imaging: ?No results found. ? ? ?PMFS History: ?Patient Active Problem List  ? Diagnosis Date Noted  ? Squamous cell cancer of skin of left hand 08/22/2021  ? Lesion of bone of left hand 08/15/2021  ? Gall bladder polyp 02/20/2016  ? Abnormal liver ultrasound 02/20/2016  ? Pigmented skin lesion of uncertain nature 73/53/2992  ? Abnormal chest x-ray 10/21/2013  ? History of rectal bleeding 10/21/2013  ? Decreased hearing 09/05/2012  ? Visit for preventive health examination 09/05/2012  ? TOBACCO USER 06/13/2009  ? COLONIC POLYPS, HX OF 04/04/2009  ? ABDOMINAL BLOATING 02/22/2009  ? HYPERLIPIDEMIA 08/26/2007  ? NEOPLASM, SKIN, UNCERTAIN BEHAVIOR 42/68/3419  ? SEBORRHEIC KERATOSIS 08/15/2007  ? OSTEOARTHRITIS 01/22/2007  ? OSTEOPOROSIS 01/22/2007  ? ?Past Medical History:  ?Diagnosis Date  ? Cataract   ? Chronic kidney disease   ? kidney stone left 2018  ? Colon polyp, hyperplastic   ? Cough, persistent 09/05/2012  ? Suspect COPD  symptoms rule out infection other.   ? Diverticulosis   ? Environmental allergies   ? History of arm fracture   ? Left  ? Osteoarthritis   ? Osteoporosis   ? DEXA 2007-3.2 spine -1.6 hip  ?  ?Family History  ?Problem Relation Age of Onset  ? Breast cancer Unknown   ? Lung disease Unknown   ? Stroke Father 64  ? Prostate cancer Father   ? Other Mother   ?     Died MVA sister died MVA age 19  ? Colon cancer Neg Hx   ?  ?Past Surgical History:  ?Procedure Laterality Date  ? EXTRACORPOREAL SHOCK WAVE LITHOTRIPSY Left 08/13/2016  ? Procedure: LEFT EXTRACORPOREAL SHOCK WAVE LITHOTRIPSY (ESWL);  Surgeon: Irine Seal, MD;  Location: [facility];  Service: Urology;  Laterality: Left;  ? NO PAST SURGERIES    ? ?Social History   ? ?Occupational History  ? Not on file  ?Tobacco Use  ? Smoking status: Every Day  ?  Packs/day: 0.50  ?  Types: Cigarettes  ? Smokeless tobacco: Never  ? Tobacco comments:  ?  smokes only in evening - states she does not inhale (04/02/17)  ?Substance and Sexual Activity  ? Alcohol use: No  ? Drug use: No  ? Sexual activity: Not on file  ? ? ? ? ? ? ?

## 2021-08-30 ENCOUNTER — Telehealth: Payer: Self-pay | Admitting: Orthopaedic Surgery

## 2021-08-30 NOTE — Telephone Encounter (Signed)
Called pt back and informed her that the referral to dermatology I sent her to reviewed her notes and felt it was best for her to go to a dermatologist who does MOHS. I informed her that [facility] and she told me she did not want to go to [facility] and see if I could find some other place besides them. I told her I will try but there arent many derms who do MOHS that are local. She said well do something fast because I have to have someone to look at my hand, its not getting any better. I said I will do the best I can.  ?

## 2021-08-30 NOTE — Telephone Encounter (Signed)
Pt called about her dermatology referral. Wondering where she is being referred to?  ? ?[facility] [phone]  ?

## 2021-08-31 NOTE — Telephone Encounter (Signed)
Returned pt call and she agreed to go ahead with the [facility].  ?

## 2021-09-06 ENCOUNTER — Encounter: Payer: Self-pay | Admitting: Orthopaedic Surgery

## 2021-09-12 ENCOUNTER — Encounter: Payer: Self-pay | Admitting: *Deleted

## 2021-09-28 DIAGNOSIS — C44629 Squamous cell carcinoma of skin of left upper limb, including shoulder: Secondary | ICD-10-CM | POA: Diagnosis not present

## 2021-12-08 DIAGNOSIS — Z789 Other specified health status: Secondary | ICD-10-CM | POA: Diagnosis not present

## 2021-12-08 DIAGNOSIS — R208 Other disturbances of skin sensation: Secondary | ICD-10-CM | POA: Diagnosis not present

## 2021-12-08 DIAGNOSIS — L821 Other seborrheic keratosis: Secondary | ICD-10-CM | POA: Diagnosis not present

## 2021-12-08 DIAGNOSIS — L820 Inflamed seborrheic keratosis: Secondary | ICD-10-CM | POA: Diagnosis not present

## 2021-12-08 DIAGNOSIS — Z85828 Personal history of other malignant neoplasm of skin: Secondary | ICD-10-CM | POA: Diagnosis not present

## 2021-12-08 DIAGNOSIS — L814 Other melanin hyperpigmentation: Secondary | ICD-10-CM | POA: Diagnosis not present

## 2021-12-08 DIAGNOSIS — Z08 Encounter for follow-up examination after completed treatment for malignant neoplasm: Secondary | ICD-10-CM | POA: Diagnosis not present

## 2021-12-08 DIAGNOSIS — L738 Other specified follicular disorders: Secondary | ICD-10-CM | POA: Diagnosis not present

## 2021-12-08 DIAGNOSIS — D1801 Hemangioma of skin and subcutaneous tissue: Secondary | ICD-10-CM | POA: Diagnosis not present

## 2022-02-26 DIAGNOSIS — Z1231 Encounter for screening mammogram for malignant neoplasm of breast: Secondary | ICD-10-CM | POA: Diagnosis not present

## 2022-04-16 DIAGNOSIS — L820 Inflamed seborrheic keratosis: Secondary | ICD-10-CM | POA: Diagnosis not present

## 2022-04-16 DIAGNOSIS — B079 Viral wart, unspecified: Secondary | ICD-10-CM | POA: Diagnosis not present

## 2022-04-16 DIAGNOSIS — D485 Neoplasm of uncertain behavior of skin: Secondary | ICD-10-CM | POA: Diagnosis not present

## 2022-04-16 DIAGNOSIS — R208 Other disturbances of skin sensation: Secondary | ICD-10-CM | POA: Diagnosis not present

## 2022-04-16 DIAGNOSIS — L309 Dermatitis, unspecified: Secondary | ICD-10-CM | POA: Diagnosis not present

## 2022-04-16 DIAGNOSIS — Z789 Other specified health status: Secondary | ICD-10-CM | POA: Diagnosis not present

## 2022-06-05 ENCOUNTER — Encounter: Payer: Self-pay | Admitting: Adult Health

## 2022-06-05 ENCOUNTER — Telehealth: Payer: Self-pay | Admitting: Internal Medicine

## 2022-06-05 ENCOUNTER — Ambulatory Visit (INDEPENDENT_AMBULATORY_CARE_PROVIDER_SITE_OTHER): Payer: Medicare Other | Admitting: Adult Health

## 2022-06-05 VITALS — BP 120/70 | HR 82 | Temp 98.0°F | Ht <= 58 in | Wt 115.0 lb

## 2022-06-05 DIAGNOSIS — R197 Diarrhea, unspecified: Secondary | ICD-10-CM

## 2022-06-05 NOTE — Telephone Encounter (Signed)
Last OV-08/02/21 with Dr. Jerilee Hoh.    Please advise

## 2022-06-05 NOTE — Progress Notes (Signed)
Subjective:    Patient ID: Megan Lucas, female    DOB: January 16, 1936, 87 y.o.   MRN: [id number]  Diarrhea     87 year old female who  has a past medical history of Cataract, Chronic kidney disease, Colon polyp, hyperplastic, Cough, persistent (09/05/2012), Diverticulosis, Environmental allergies, History of arm fracture, Osteoarthritis, and Osteoporosis.  She present to the office today for an acute issue of diarrhea. She reports having diarrhea for the last week, started a few hours after christmas dinner. Throughout the week the diarrhea continued and she would have atleast two episodes of diarrhea per day. Yesterday morning she had one episode of diarrhea and today she has not had any diarrhea and had a normal bowel movement.   She used pepto bismol at home, last dose 3 days ago   She did not have any fevers, chills, nausea/vomiting, or loss of appetite.   Review of Systems  Gastrointestinal:  Positive for diarrhea.   See HPI   Past Medical History:  Diagnosis Date   Cataract    Chronic kidney disease    kidney stone left 2018   Colon polyp, hyperplastic    Cough, persistent 09/05/2012   Suspect COPD symptoms rule out infection other.    Diverticulosis    Environmental allergies    History of arm fracture    Left   Osteoarthritis    Osteoporosis    DEXA 2007-3.2 spine -1.6 hip    Social History   Socioeconomic History   Marital status: Divorced    Spouse name: Not on file   Number of children: Not on file   Years of education: Not on file   Highest education level: Not on file  Occupational History   Not on file  Tobacco Use   Smoking status: Every Day    Packs/day: 0.50    Types: Cigarettes   Smokeless tobacco: Never   Tobacco comments:    smokes only in evening - states she does not inhale (04/02/17)  Substance and Sexual Activity   Alcohol use: No   Drug use: No   Sexual activity: Not on file  Other Topics Concern   Not on file  Social History Narrative    Childbirth x2 g2 p2    Negative alcohol tobacco times many years 8 to 10 in the evening   Does do regular exercise seat belt use.   Divorced widowed Public house manager   No pets   Social Determinants of Health   Financial Resource Strain: Low Risk  (07/10/2021)   Overall Financial Resource Strain (CARDIA)    Difficulty of Paying Living Expenses: Not hard at all  Food Insecurity: No Food Insecurity (07/10/2021)   Hunger Vital Sign    Worried About Running Out of Food in the Last Year: Never true    [place] in the Last Year: Never true  Transportation Needs: No Transportation Needs (07/10/2021)   PRAPARE - Hydrologist (Medical): No    Lack of Transportation (Non-Medical): No  Physical Activity: Insufficiently Active (07/10/2021)   Exercise Vital Sign    Days of Exercise per Week: 2 days    Minutes of Exercise per Session: 60 min  Stress: No Stress Concern Present (07/10/2021)   [place]    Feeling of Stress : Not at all  Social Connections: Moderately Integrated (07/10/2021)   Social Connection and Isolation Panel [NHANES]  Frequency of Communication with Friends and Family: More than three times a week    Frequency of Social Gatherings with Friends and Family: More than three times a week    Attends Religious Services: More than 4 times per year    Active Member of [employer] or Organizations: Yes    Attends Music therapist: More than 4 times per year    Marital Status: Divorced  Intimate Partner Violence: Not At Risk (07/10/2021)   Humiliation, Afraid, Rape, and Kick questionnaire    Fear of Current or Ex-Partner: No    Emotionally Abused: No    Physically Abused: No    Sexually Abused: No    Past Surgical History:  Procedure Laterality Date   EXTRACORPOREAL SHOCK WAVE LITHOTRIPSY Left 08/13/2016   Procedure: LEFT EXTRACORPOREAL SHOCK WAVE LITHOTRIPSY (ESWL);   Surgeon: Irine Seal, MD;  Location: [facility];  Service: Urology;  Laterality: Left;   NO PAST SURGERIES      Family History  Problem Relation Age of Onset   Breast cancer Unknown    Lung disease Unknown    Stroke Father 63   Prostate cancer Father    Other Mother        Died MVA sister died MVA age 54   Colon cancer Neg Hx     No Known Allergies  Current Outpatient Medications on File Prior to Visit  Medication Sig Dispense Refill   aspirin EC 81 MG tablet Take 81 mg by mouth daily.     Calcium Carbonate-Vit D-Min (CALCIUM 1200 PO) Take by mouth 2 (two) times daily.     Ginger, Zingiber officinalis, (GINGER ROOT PO) Take by mouth.     glucosamine-chondroitin 500-400 MG tablet Take 1 tablet by mouth 2 (two) times daily.     LUTEIN PO Take 1 tablet by mouth daily.     Multiple Vitamin (MULTIVITAMIN) tablet Take 1 tablet by mouth daily.     Omega-3 Fatty Acids (FISH OIL PO) Take by mouth daily.     POTASSIUM CITRATE PO Take by mouth daily.     TURMERIC PO Take by mouth.     No current facility-administered medications on file prior to visit.    BP 120/70   Pulse 82   Temp 98 F (36.7 C) (Oral)   Ht '4\' 10"'$  (1.473 m)   Wt 115 lb (52.2 kg)   SpO2 95%   BMI 24.04 kg/m       Objective:   Physical Exam Vitals and nursing note reviewed.  Constitutional:      Appearance: Normal appearance.  Cardiovascular:     Rate and Rhythm: Normal rate and regular rhythm.     Pulses: Normal pulses.     Heart sounds: Normal heart sounds.  Pulmonary:     Effort: Pulmonary effort is normal.     Breath sounds: Normal breath sounds.  Abdominal:     General: Abdomen is flat. Bowel sounds are normal. There is no distension.     Palpations: Abdomen is soft. There is no mass.     Tenderness: There is no abdominal tenderness. There is no guarding.     Hernia: A hernia is present.  Skin:    General: Skin is warm and dry.  Neurological:     General: No focal deficit present.     Mental  Status: She is alert and oriented to person, place, and time.  Psychiatric:        Mood and Affect: Mood normal.  Behavior: Behavior normal.        Thought Content: Thought content normal.        Judgment: Judgment normal.       Assessment & Plan:  1. Diarrhea, unspecified type - likely from something she ate but seem to have resolved  - She appears well and in no acute distress - Return back to normal activity   Dorothyann Peng, NP

## 2022-06-05 NOTE — Telephone Encounter (Signed)
Pt is having diarrhea and per pt she spoke with triage nurse yesterday and was advise to go to ER or UC and pt decline.

## 2022-06-05 NOTE — Telephone Encounter (Signed)
Pt has appt with C Nafziger NP on 06/05/22 at 3:30

## 2022-06-14 DIAGNOSIS — L538 Other specified erythematous conditions: Secondary | ICD-10-CM | POA: Diagnosis not present

## 2022-06-14 DIAGNOSIS — L820 Inflamed seborrheic keratosis: Secondary | ICD-10-CM | POA: Diagnosis not present

## 2022-06-14 DIAGNOSIS — Z789 Other specified health status: Secondary | ICD-10-CM | POA: Diagnosis not present

## 2022-06-14 DIAGNOSIS — R58 Hemorrhage, not elsewhere classified: Secondary | ICD-10-CM | POA: Diagnosis not present

## 2022-07-11 ENCOUNTER — Telehealth: Payer: Self-pay | Admitting: Internal Medicine

## 2022-07-11 ENCOUNTER — Ambulatory Visit (INDEPENDENT_AMBULATORY_CARE_PROVIDER_SITE_OTHER): Payer: Medicare Other

## 2022-07-11 VITALS — BP 118/62 | HR 70 | Temp 97.7°F | Ht <= 58 in | Wt 115.9 lb

## 2022-07-11 DIAGNOSIS — Z0000 Encounter for general adult medical examination without abnormal findings: Secondary | ICD-10-CM

## 2022-07-11 NOTE — Telephone Encounter (Signed)
Pt stated she took a COVID shot on Wed and by Fri she was feeling really bad; temp was 99.7 and she felt really nasally congested. Pt was wondering if she had a reaction to the shot?  Please call pt with response.   Please advise.

## 2022-07-11 NOTE — Telephone Encounter (Signed)
Spoke to patient. She clarify that she is not feeling really bad but having lightheaded/dizzyness, chills, nasal congestion, mild headache, a little tired and scratchy throat. Denied cough. Taking tylenol.   Patient wants to know if this is reaction from her covid shot on Wednesday. Please advise.

## 2022-07-11 NOTE — Progress Notes (Addendum)
Subjective:   Megan Lucas is a 87 y.o. female who presents for Medicare Annual (Subsequent) preventive examination.  Review of Systems      Cardiac Risk Factors include: advanced age (>7mn, >>49women)     Objective:    Today's Vitals   07/11/22 0911  BP: 118/62  Pulse: 70  Temp: 97.7 F (36.5 C)  TempSrc: Oral  SpO2: 97%  Weight: 115 lb 14.4 oz (52.6 kg)  Height: '4\' 10"'$  (1.473 m)   Body mass index is 24.22 kg/m.     07/11/2022    9:20 AM 07/10/2021   10:10 AM 08/13/2016    9:30 AM 11/24/2013    3:34 PM  Advanced Directives  Does Patient Have a Medical Advance Directive? Yes Yes Yes Patient has advance directive, copy not in chart  Type of Advance Directive [place] Lake[place] will HP[place]Living will HEscudilla BonitaLiving will Living will;Healthcare Power of Attorney  Does patient want to make changes to medical advance directive?  No - Patient declined    Copy of [place] Chart? No - copy requested No - copy requested No - copy requested     Current Medications (verified) Outpatient Encounter Medications as of 07/11/2022  Medication Sig   aspirin EC 81 MG tablet Take 81 mg by mouth daily.   Calcium Carbonate-Vit D-Min (CALCIUM 1200 PO) Take by mouth 2 (two) times daily.   Ginger, Zingiber officinalis, (GINGER ROOT PO) Take by mouth.   glucosamine-chondroitin 500-400 MG tablet Take 1 tablet by mouth 2 (two) times daily.   LUTEIN PO Take 1 tablet by mouth daily.   Multiple Vitamin (MULTIVITAMIN) tablet Take 1 tablet by mouth daily.   Omega-3 Fatty Acids (FISH OIL PO) Take by mouth daily.   POTASSIUM CITRATE PO Take by mouth daily.   TURMERIC PO Take by mouth.   No facility-administered encounter medications on file as of 07/11/2022.    Allergies (verified) Patient has no known allergies.   History: Past Medical History:  Diagnosis Date   Cataract    Chronic kidney disease    kidney  stone left 2018   Colon polyp, hyperplastic    Cough, persistent 09/05/2012   Suspect COPD symptoms rule out infection other.    Diverticulosis    Environmental allergies    History of arm fracture    Left   Osteoarthritis    Osteoporosis    DEXA 2007-3.2 spine -1.6 hip   Past Surgical History:  Procedure Laterality Date   EXTRACORPOREAL SHOCK WAVE LITHOTRIPSY Left 08/13/2016   Procedure: LEFT EXTRACORPOREAL SHOCK WAVE LITHOTRIPSY (ESWL);  Surgeon: JIrine Seal MD;  Location: [facility];  Service: Urology;  Laterality: Left;   NO PAST SURGERIES     Family History  Problem Relation Age of Onset   Breast cancer Unknown    Lung disease Unknown    Stroke Father 746  Prostate cancer Father    Other Mother        Died MVA sister died MVA age 85  Colon cancer Neg Hx    Social History   Socioeconomic History   Marital status: Divorced    Spouse name: Not on file   Number of children: Not on file   Years of education: Not on file   Highest education level: Not on file  Occupational History   Not on file  Tobacco Use   Smoking status: Every Day    Packs/day: 0.50  Types: Cigarettes   Smokeless tobacco: Never   Tobacco comments:    smokes only in evening - states she does not inhale (04/02/17)  Substance and Sexual Activity   Alcohol use: No   Drug use: No   Sexual activity: Not on file  Other Topics Concern   Not on file  Social History Narrative   Childbirth x2 g2 p2    Negative alcohol tobacco times many years 8 to 10 in the evening   Does do regular exercise seat belt use.   Divorced widowed Public house manager   No pets   Social Determinants of Health   Financial Resource Strain: [place]  (07/11/2022)   Overall Financial Resource Strain (CARDIA)    Difficulty of Paying Living Expenses: Not hard at all  Food Insecurity: No Food Insecurity (07/11/2022)   Hunger Vital Sign    Worried About Running Out of Food in the Last Year: Never true    Ran Out of Food in  the Last Year: Never true  Transportation Needs: No Transportation Needs (07/11/2022)   PRAPARE - Hydrologist (Medical): No    Lack of Transportation (Non-Medical): No  Physical Activity: Sufficiently Active (07/11/2022)   Exercise Vital Sign    Days of Exercise per Week: 5 days    Minutes of Exercise per Session: 120 min  Stress: No Stress Concern Present (07/11/2022)   [place]    Feeling of Stress : Not at all  Social Connections: [place] (07/11/2022)   Social Connection and Isolation Panel [NHANES]    Frequency of Communication with Friends and Family: More than three times a week    Frequency of Social Gatherings with Friends and Family: More than three times a week    Attends Religious Services: More than 4 times per year    Active Member of [employer] or Organizations: Yes    Attends Music therapist: More than 4 times per year    Marital Status: Married    Tobacco Counseling Ready to quit: No Counseling given: Yes Tobacco comments: smokes only in evening - states she does not inhale (04/02/17)   Clinical Intake:  Pre-visit preparation completed: No  Pain : No/denies pain     BMI - recorded: 24.22 Nutritional Status: BMI of 19-24  Normal Nutritional Risks: None Diabetes: No  How often do you need to have someone help you when you read instructions, pamphlets, or other written materials from your doctor or pharmacy?: 1 - Never  Diabetic?  No  Interpreter Needed?: No  Information entered by :: Rolene Arbour LPN   Activities of Daily Living    07/11/2022    9:19 AM  In your present state of health, do you have any difficulty performing the following activities:  Hearing? 1  Comment Wears hearing aids  Vision? 0  Difficulty concentrating or making decisions? 0  Walking or climbing stairs? 0  Dressing or bathing? 0  Doing errands, shopping? 0   Preparing Food and eating ? N  Using the Toilet? N  In the past six months, have you accidently leaked urine? N  Do you have problems with loss of bowel control? N  Managing your Medications? N  Managing your Finances? N  Housekeeping or managing your Housekeeping? N    Patient Care Team: Panosh, Standley Brooking, MD as PCP - General  Indicate any recent Medical Services you may have received from other  than [facility] providers in the past year (date may be approximate).     Assessment:   This is a routine wellness examination for Megan Lucas.  Hearing/Vision screen Hearing Screening - Comments:: Wears hearing aids Vision Screening - Comments:: Wears reading glasses - up to date with routine eye exams with  Dr Celene Squibb  Dietary issues and exercise activities discussed: Exercise limited by: None identified   Goals Addressed               This Visit's Progress     Increase physical activity (pt-stated)        Maintain exercise and good health.       Depression Screen    07/11/2022    9:17 AM 07/10/2021   10:04 AM 11/30/2020    3:10 PM 04/02/2017   11:03 AM 10/21/2013   11:01 AM 09/05/2012    6:20 PM  PHQ 2/9 Scores  PHQ - 2 Score 0 0 1 0 0 0  PHQ- 9 Score   1       Fall Risk    07/11/2022    9:20 AM 07/10/2021   10:08 AM 11/30/2020    2:40 PM 04/02/2017   11:03 AM 10/21/2013   11:01 AM  Fall Risk   Falls in the past year? 0 0 1 No No  Number falls in past yr: 0 0 1    Injury with Fall? 0 0 0    Risk for fall due to : No Fall Risks      Follow up Falls prevention discussed        FALL RISK PREVENTION PERTAINING TO THE HOME:  Any stairs in or around the home? Yes  If so, are there any without handrails? No  Home free of loose throw rugs in walkways, pet beds, electrical cords, etc? Yes  Adequate lighting in your home to reduce risk of falls? Yes   ASSISTIVE DEVICES UTILIZED TO PREVENT FALLS:  Life alert? Yes  Use of a cane, walker or w/c? No  Grab bars in the bathroom? No   Shower chair or bench in shower? No  Elevated toilet seat or a handicapped toilet? No   TIMED UP AND GO:  Was the test performed? Yes .  Length of time to ambulate 10 feet: 10 sec.   Gait steady and fast without use of assistive device  Cognitive Function:        07/11/2022    9:21 AM 07/10/2021   10:11 AM  6CIT Screen  What Year? 0 points 0 points  What month? 0 points 0 points  What time? 0 points 0 points  Count back from 20 0 points 0 points  Months in reverse 0 points 0 points  Repeat phrase 0 points 0 points  Total Score 0 points 0 points    Immunizations Immunization History  Administered Date(s) Administered   Fluad Quad(high Dose 65+) 04/06/2021   Influenza Whole 03/07/2009   PFIZER(Purple Top)SARS-COV-2 Vaccination 07/09/2019, 08/03/2019, 04/02/2020   Pneumococcal Conjugate-13 10/21/2013   Pneumococcal Polysaccharide-23 03/07/2009   Td 09/25/2004    TDAP status: Due, Education has been provided regarding the importance of this vaccine. Advised may receive this vaccine at local pharmacy or Health Dept. Aware to provide a copy of the vaccination record if obtained from local pharmacy or Health Dept. Verbalized acceptance and understanding.  Flu Vaccine status: Declined, Education has been provided regarding the importance of this vaccine but patient still declined. Advised may receive this vaccine at local  pharmacy or Health Dept. Aware to provide a copy of the vaccination record if obtained from local pharmacy or Health Dept. Verbalized acceptance and understanding.  Pneumococcal vaccine status: Up to date  Covid-19 vaccine status: Completed vaccines  Qualifies for Shingles Vaccine? Yes   Zostavax completed No   Shingrix Completed?: No.    Education has been provided regarding the importance of this vaccine. Patient has been advised to call insurance company to determine out of pocket expense if they have not yet received this vaccine. Advised may also receive  vaccine at local pharmacy or Health Dept. Verbalized acceptance and understanding.  Screening Tests Health Maintenance  Topic Date Due   DTaP/Tdap/Td (2 - Tdap) 09/26/2014   COVID-19 Vaccine (4 - 2023-24 season) 07/27/2022 (Originally 02/02/2022)   INFLUENZA VACCINE  09/02/2022 (Originally 01/02/2022)   Zoster Vaccines- Shingrix (1 of 2) 10/09/2022 (Originally 07/28/1954)   MAMMOGRAM  02/27/2023   Medicare Annual Wellness (AWV)  07/12/2023   Pneumonia Vaccine 62+ Years old  Completed   DEXA SCAN  Completed   HPV VACCINES  Aged Out    Health Maintenance  Health Maintenance Due  Topic Date Due   DTaP/Tdap/Td (2 - Tdap) 09/26/2014    Colorectal cancer screening: No longer required.   Mammogram status: No longer required due to Age.  Bone Density status: Completed 04/09/17. Results reflect: Bone density results: OSTEOPOROSIS. Repeat every   years.  Lung Cancer Screening: (Low Dose CT Chest recommended if Age 25-80 years, 30 pack-year currently smoking OR have quit w/in 15years.) does not qualify.    Additional Screening:  Hepatitis C Screening: does not qualify; Completed    Vision Screening: Recommended annual ophthalmology exams for early detection of glaucoma and other disorders of the eye. Is the patient up to date with their annual eye exam?  Yes  Who is the provider or what is the name of the office in which the patient attends annual eye exams? Dr Celene Squibb If pt is not established with a provider, would they like to be referred to a provider to establish care? No .   Dental Screening: Recommended annual dental exams for proper oral hygiene  Community Resource Referral / Chronic Care Management:  CRR required this visit?  No   CCM required this visit?  No      Plan:     I have personally reviewed and noted the following in the patient's chart:   Medical and social history Use of alcohol, tobacco or illicit drugs  Current medications and supplements including  opioid prescriptions. Patient is not currently taking opioid prescriptions. Functional ability and status Nutritional status Physical activity Advanced directives List of other physicians Hospitalizations, surgeries, and ER visits in previous 12 months Vitals Screenings to include cognitive, depression, and falls Referrals and appointments  In addition, I have reviewed and discussed with patient certain preventive protocols, quality metrics, and best practice recommendations. A written personalized care plan for preventive services as well as general preventive health recommendations were provided to patient.     Criselda Peaches, LPN   12/07/1023   Nurse Notes: None

## 2022-07-11 NOTE — Patient Instructions (Addendum)
[name] , Thank you for taking time to come for your Medicare Wellness Visit. I appreciate your ongoing commitment to your health goals. Please review the following plan we discussed and let me know if I can assist you in the future.   These are the goals we discussed:  Goals       Increase physical activity (pt-stated)      Maintain exercise and good health.        This is a list of the screening recommended for you and due dates:  Health Maintenance  Topic Date Due   DTaP/Tdap/Td vaccine (2 - Tdap) 09/26/2014   COVID-19 Vaccine (4 - 2023-24 season) 07/27/2022*   Flu Shot  09/02/2022*   Zoster (Shingles) Vaccine (1 of 2) 10/09/2022*   Mammogram  02/27/2023   Medicare Annual Wellness Visit  07/12/2023   Pneumonia Vaccine  Completed   DEXA scan (bone density measurement)  Completed   HPV Vaccine  Aged Out  *Topic was postponed. The date shown is not the original due date.    Advanced directives: Please bring a copy of your health care power of attorney and living will to the office to be added to your chart at your convenience.   Conditions/risks identified: None  Next appointment: Follow up in one year for your annual wellness visit     Preventive Care 65 Years and Older, Female Preventive care refers to lifestyle choices and visits with your health care provider that can promote health and wellness. What does preventive care include? A yearly physical exam. This is also called an annual well check. Dental exams once or twice a year. Routine eye exams. Ask your health care provider how often you should have your eyes checked. Personal lifestyle choices, including: Daily care of your teeth and gums. Regular physical activity. Eating a healthy diet. Avoiding tobacco and drug use. Limiting alcohol use. Practicing safe sex. Taking low-dose aspirin every day. Taking vitamin and mineral supplements as recommended by your health care provider. What happens during an annual  well check? The services and screenings done by your health care provider during your annual well check will depend on your age, overall health, lifestyle risk factors, and family history of disease. Counseling  Your health care provider may ask you questions about your: Alcohol use. Tobacco use. Drug use. Emotional well-being. Home and relationship well-being. Sexual activity. Eating habits. History of falls. Memory and ability to understand (cognition). Work and work Statistician. Reproductive health. Screening  You may have the following tests or measurements: Height, weight, and BMI. Blood pressure. Lipid and cholesterol levels. These may be checked every 5 years, or more frequently if you are over 30 years old. Skin check. Lung cancer screening. You may have this screening every year starting at age 55 if you have a 30-pack-year history of smoking and currently smoke or have quit within the past 15 years. Fecal occult blood test (FOBT) of the stool. You may have this test every year starting at age 13. Flexible sigmoidoscopy or colonoscopy. You may have a sigmoidoscopy every 5 years or a colonoscopy every 10 years starting at age 60. Hepatitis C blood test. Hepatitis B blood test. Sexually transmitted disease (STD) testing. Diabetes screening. This is done by checking your blood sugar (glucose) after you have not eaten for a while (fasting). You may have this done every 1-3 years. Bone density scan. This is done to screen for osteoporosis. You may have this done starting at age 25. Mammogram.  This may be done every 1-2 years. Talk to your health care provider about how often you should have regular mammograms. Talk with your health care provider about your test results, treatment options, and if necessary, the need for more tests. Vaccines  Your health care provider may recommend certain vaccines, such as: Influenza vaccine. This is recommended every year. Tetanus, diphtheria,  and acellular pertussis (Tdap, Td) vaccine. You may need a Td booster every 10 years. Zoster vaccine. You may need this after age 28. Pneumococcal 13-valent conjugate (PCV13) vaccine. One dose is recommended after age 13. Pneumococcal polysaccharide (PPSV23) vaccine. One dose is recommended after age 52. Talk to your health care provider about which screenings and vaccines you need and how often you need them. This information is not intended to replace advice given to you by your health care provider. Make sure you discuss any questions you have with your health care provider. Document Released: 06/17/2015 Document Revised: 02/08/2016 Document Reviewed: 03/22/2015 Elsevier Interactive Patient Education  2017 [employer] Prevention in the Home Falls can cause injuries. They can happen to people of all ages. There are many things you can do to make your home safe and to help prevent falls. What can I do on the outside of my home? Regularly fix the edges of walkways and driveways and fix any cracks. Remove anything that might make you trip as you walk through a door, such as a raised step or threshold. Trim any bushes or trees on the path to your home. Use bright outdoor lighting. Clear any walking paths of anything that might make someone trip, such as rocks or tools. Regularly check to see if handrails are loose or broken. Make sure that both sides of any steps have handrails. Any raised decks and porches should have guardrails on the edges. Have any leaves, snow, or ice cleared regularly. Use sand or salt on walking paths during winter. Clean up any spills in your garage right away. This includes oil or grease spills. What can I do in the bathroom? Use night lights. Install grab bars by the toilet and in the tub and shower. Do not use towel bars as grab bars. Use non-skid mats or decals in the tub or shower. If you need to sit down in the shower, use a plastic, non-slip  stool. Keep the floor dry. Clean up any water that spills on the floor as soon as it happens. Remove soap buildup in the tub or shower regularly. Attach bath mats securely with double-sided non-slip rug tape. Do not have throw rugs and other things on the floor that can make you trip. What can I do in the bedroom? Use night lights. Make sure that you have a light by your bed that is easy to reach. Do not use any sheets or blankets that are too big for your bed. They should not hang down onto the floor. Have a firm chair that has side arms. You can use this for support while you get dressed. Do not have throw rugs and other things on the floor that can make you trip. What can I do in the kitchen? Clean up any spills right away. Avoid walking on wet floors. Keep items that you use a lot in easy-to-reach places. If you need to reach something above you, use a strong step stool that has a grab bar. Keep electrical cords out of the way. Do not use floor polish or wax that makes floors slippery. If you  must use wax, use non-skid floor wax. Do not have throw rugs and other things on the floor that can make you trip. What can I do with my stairs? Do not leave any items on the stairs. Make sure that there are handrails on both sides of the stairs and use them. Fix handrails that are broken or loose. Make sure that handrails are as long as the stairways. Check any carpeting to make sure that it is firmly attached to the stairs. Fix any carpet that is loose or worn. Avoid having throw rugs at the top or bottom of the stairs. If you do have throw rugs, attach them to the floor with carpet tape. Make sure that you have a light switch at the top of the stairs and the bottom of the stairs. If you do not have them, ask someone to add them for you. What else can I do to help prevent falls? Wear shoes that: Do not have high heels. Have rubber bottoms. Are comfortable and fit you well. Are closed at the  toe. Do not wear sandals. If you use a stepladder: Make sure that it is fully opened. Do not climb a closed stepladder. Make sure that both sides of the stepladder are locked into place. Ask someone to hold it for you, if possible. Clearly mark and make sure that you can see: Any grab bars or handrails. First and last steps. Where the edge of each step is. Use tools that help you move around (mobility aids) if they are needed. These include: Canes. Walkers. Scooters. Crutches. Turn on the lights when you go into a dark area. Replace any light bulbs as soon as they burn out. Set up your furniture so you have a clear path. Avoid moving your furniture around. If any of your floors are uneven, fix them. If there are any pets around you, be aware of where they are. Review your medicines with your doctor. Some medicines can make you feel dizzy. This can increase your chance of falling. Ask your doctor what other things that you can do to help prevent falls. This information is not intended to replace advice given to you by your health care provider. Make sure you discuss any questions you have with your health care provider. Document Released: 03/17/2009 Document Revised: 10/27/2015 Document Reviewed: 06/25/2014 Elsevier Interactive Patient Education  2017 [employer].

## 2022-07-13 NOTE — Telephone Encounter (Signed)
Congestion usually not a side effect  could be getting a viral infection  but can feel tired achy fever chills  from the vaccine.  If having fever or not getting better next week  let us know and consider visit for evaluation if needed

## 2022-07-18 NOTE — Telephone Encounter (Signed)
Attempt to reach pt. Left a voicemail to call us back.

## 2022-07-20 NOTE — Telephone Encounter (Signed)
2nd attempt pt with home number. Left a voicemail to call us back.

## 2022-07-24 NOTE — Telephone Encounter (Signed)
Spoke to patient. Pt reports she was out of town when the calls was made.   She updates the congestion had went away. Feels fine. No further action is needed.

## 2022-08-06 NOTE — Progress Notes (Unsigned)
No chief complaint on file.   HPI: Megan Lucas 87 y.o. come in for  ROS: See pertinent positives and negatives per HPI.  Past Medical History:  Diagnosis Date   Cataract    Chronic kidney disease    kidney stone left 2018   Colon polyp, hyperplastic    Cough, persistent 09/05/2012   Suspect COPD symptoms rule out infection other.    Diverticulosis    Environmental allergies    History of arm fracture    Left   Osteoarthritis    Osteoporosis    DEXA 2007-3.2 spine -1.6 hip    Family History  Problem Relation Age of Onset   Breast cancer Unknown    Lung disease Unknown    Stroke Father 22   Prostate cancer Father    Other Mother        Died MVA sister died MVA age 52   Colon cancer Neg Hx     Social History   Socioeconomic History   Marital status: Divorced    Spouse name: Not on file   Number of children: Not on file   Years of education: Not on file   Highest education level: Not on file  Occupational History   Not on file  Tobacco Use   Smoking status: Every Day    Packs/day: 0.50    Types: Cigarettes   Smokeless tobacco: Never   Tobacco comments:    smokes only in evening - states she does not inhale (04/02/17)  Substance and Sexual Activity   Alcohol use: No   Drug use: No   Sexual activity: Not on file  Other Topics Concern   Not on file  Social History Narrative   Childbirth x2 g2 p2    Negative alcohol tobacco times many years 8 to 10 in the evening   Does do regular exercise seat belt use.   Divorced widowed Public house manager   No pets   Social Determinants of Health   Financial Resource Strain: [place]  (07/11/2022)   Overall Financial Resource Strain (CARDIA)    Difficulty of Paying Living Expenses: Not hard at all  Food Insecurity: No Food Insecurity (07/11/2022)   Hunger Vital Sign    Worried About Running Out of Food in the Last Year: Never true    Ran Out of Food in the Last Year: Never true  Transportation Needs: No  Transportation Needs (07/11/2022)   PRAPARE - Hydrologist (Medical): No    Lack of Transportation (Non-Medical): No  Physical Activity: Sufficiently Active (07/11/2022)   Exercise Vital Sign    Days of Exercise per Week: 5 days    Minutes of Exercise per Session: 120 min  Stress: No Stress Concern Present (07/11/2022)   E. Lopez    Feeling of Stress : Not at all  Social Connections: [place] (07/11/2022)   Social Connection and Isolation Panel [NHANES]    Frequency of Communication with Friends and Family: More than three times a week    Frequency of Social Gatherings with Friends and Family: More than three times a week    Attends Religious Services: More than 4 times per year    Active Member of [employer] or Organizations: Yes    Attends Music therapist: More than 4 times per year    Marital Status: Married    Outpatient Medications Prior to Visit  Medication Sig Dispense Refill  aspirin EC 81 MG tablet Take 81 mg by mouth daily.     Calcium Carbonate-Vit D-Min (CALCIUM 1200 PO) Take by mouth 2 (two) times daily.     Ginger, Zingiber officinalis, (GINGER ROOT PO) Take by mouth.     glucosamine-chondroitin 500-400 MG tablet Take 1 tablet by mouth 2 (two) times daily.     LUTEIN PO Take 1 tablet by mouth daily.     Multiple Vitamin (MULTIVITAMIN) tablet Take 1 tablet by mouth daily.     Omega-3 Fatty Acids (FISH OIL PO) Take by mouth daily.     POTASSIUM CITRATE PO Take by mouth daily.     TURMERIC PO Take by mouth.     No facility-administered medications prior to visit.     EXAM:  There were no vitals taken for this visit.  There is no height or weight on file to calculate BMI.  GENERAL: vitals reviewed and listed above, alert, oriented, appears well hydrated and in no acute distress HEENT: atraumatic, conjunctiva  clear, no obvious abnormalities on inspection  of external nose and ears OP : no lesion edema or exudate  NECK: no obvious masses on inspection palpation  LUNGS: clear to auscultation bilaterally, no wheezes, rales or rhonchi, good air movement CV: HRRR, no clubbing cyanosis or  peripheral edema nl cap refill  MS: moves all extremities without noticeable focal  abnormality PSYCH: pleasant and cooperative, no obvious depression or anxiety Lab Results  Component Value Date   WBC 7.3 11/30/2020   HGB 13.9 11/30/2020   HCT 40.7 11/30/2020   PLT 182.0 11/30/2020   GLUCOSE 88 11/30/2020   CHOL 197 04/02/2017   TRIG 106.0 04/02/2017   HDL 58.60 04/02/2017   LDLDIRECT 135.6 06/13/2009   LDLCALC 118 (H) 04/02/2017   ALT 15 11/30/2020   AST 19 11/30/2020   NA 142 11/30/2020   K 4.0 11/30/2020   CL 105 11/30/2020   CREATININE 0.77 11/30/2020   BUN 15 11/30/2020   CO2 28 11/30/2020   TSH 0.89 10/21/2013   INR 1.0 02/24/2016   BP Readings from Last 3 Encounters:  07/11/22 118/62  06/05/22 120/70  08/02/21 118/87    ASSESSMENT AND PLAN:  Discussed the following assessment and plan:  No diagnosis found.  -Patient advised to return or notify health care team  if  new concerns arise.  There are no Patient Instructions on file for this visit.   Standley Brooking. Ioannis Schuh M.D.

## 2022-08-07 ENCOUNTER — Encounter: Payer: Self-pay | Admitting: Internal Medicine

## 2022-08-07 ENCOUNTER — Ambulatory Visit (INDEPENDENT_AMBULATORY_CARE_PROVIDER_SITE_OTHER): Payer: Medicare Other | Admitting: Internal Medicine

## 2022-08-07 VITALS — BP 118/70 | HR 89 | Temp 98.0°F | Ht <= 58 in | Wt 117.6 lb

## 2022-08-07 DIAGNOSIS — K429 Umbilical hernia without obstruction or gangrene: Secondary | ICD-10-CM | POA: Diagnosis not present

## 2022-08-07 DIAGNOSIS — Z974 Presence of external hearing-aid: Secondary | ICD-10-CM

## 2022-08-07 DIAGNOSIS — R194 Change in bowel habit: Secondary | ICD-10-CM

## 2022-08-07 DIAGNOSIS — M810 Age-related osteoporosis without current pathological fracture: Secondary | ICD-10-CM

## 2022-08-07 DIAGNOSIS — Z79899 Other long term (current) drug therapy: Secondary | ICD-10-CM | POA: Diagnosis not present

## 2022-08-07 DIAGNOSIS — Z0000 Encounter for general adult medical examination without abnormal findings: Secondary | ICD-10-CM

## 2022-08-07 DIAGNOSIS — Z87442 Personal history of urinary calculi: Secondary | ICD-10-CM

## 2022-08-07 DIAGNOSIS — Z8379 Family history of other diseases of the digestive system: Secondary | ICD-10-CM

## 2022-08-07 DIAGNOSIS — E2839 Other primary ovarian failure: Secondary | ICD-10-CM

## 2022-08-07 LAB — CBC WITH DIFFERENTIAL/PLATELET
Basophils Absolute: 0 10*3/uL (ref 0.0–0.1)
Basophils Relative: 0.5 % (ref 0.0–3.0)
Eosinophils Absolute: 0.2 10*3/uL (ref 0.0–0.7)
Eosinophils Relative: 2.8 % (ref 0.0–5.0)
HCT: 43.6 % (ref 36.0–46.0)
Hemoglobin: 14.5 g/dL (ref 12.0–15.0)
Lymphocytes Relative: 23.1 % (ref 12.0–46.0)
Lymphs Abs: 1.7 10*3/uL (ref 0.7–4.0)
MCHC: 33.3 g/dL (ref 30.0–36.0)
MCV: 93 fl (ref 78.0–100.0)
Monocytes Absolute: 0.5 10*3/uL (ref 0.1–1.0)
Monocytes Relative: 6.4 % (ref 3.0–12.0)
Neutro Abs: 4.9 10*3/uL (ref 1.4–7.7)
Neutrophils Relative %: 67.2 % (ref 43.0–77.0)
Platelets: 175 10*3/uL (ref 150.0–400.0)
RBC: 4.69 Mil/uL (ref 3.87–5.11)
RDW: 13.6 % (ref 11.5–15.5)
WBC: 7.2 10*3/uL (ref 4.0–10.5)

## 2022-08-07 LAB — URINALYSIS, ROUTINE W REFLEX MICROSCOPIC
Bilirubin Urine: NEGATIVE
Hgb urine dipstick: NEGATIVE
Ketones, ur: NEGATIVE
Leukocytes,Ua: NEGATIVE
Nitrite: NEGATIVE
Specific Gravity, Urine: 1.02 (ref 1.000–1.030)
Total Protein, Urine: NEGATIVE
Urine Glucose: NEGATIVE
Urobilinogen, UA: 0.2 (ref 0.0–1.0)
pH: 6 (ref 5.0–8.0)

## 2022-08-07 LAB — COMPREHENSIVE METABOLIC PANEL
ALT: 13 U/L (ref 0–35)
AST: 17 U/L (ref 0–37)
Albumin: 3.9 g/dL (ref 3.5–5.2)
Alkaline Phosphatase: 65 U/L (ref 39–117)
BUN: 13 mg/dL (ref 6–23)
CO2: 30 mEq/L (ref 19–32)
Calcium: 10.5 mg/dL (ref 8.4–10.5)
Chloride: 105 mEq/L (ref 96–112)
Creatinine, Ser: 0.74 mg/dL (ref 0.40–1.20)
GFR: 73.98 mL/min (ref >60.00)
Glucose, Bld: 77 mg/dL (ref 70–99)
Potassium: 4.4 mEq/L (ref 3.5–5.1)
Sodium: 141 mEq/L (ref 135–145)
Total Bilirubin: 0.5 mg/dL (ref 0.2–1.2)
Total Protein: 6.6 g/dL (ref 6.0–8.3)

## 2022-08-07 LAB — LIPID PANEL
Cholesterol: 192 mg/dL (ref 0–200)
HDL: 59.9 mg/dL (ref >39.00)
LDL Cholesterol: 104 mg/dL — ABNORMAL HIGH (ref 0–99)
NonHDL: 132.46
Total CHOL/HDL Ratio: 3
Triglycerides: 141 mg/dL (ref 0.0–149.0)
VLDL: 28.2 mg/dL (ref 0.0–40.0)

## 2022-08-07 LAB — TSH: TSH: 1.01 u[IU]/mL (ref 0.35–5.50)

## 2022-08-07 LAB — VITAMIN D 25 HYDROXY (VIT D DEFICIENCY, FRACTURES): VITD: 40.1 ng/mL (ref 30.00–100.00)

## 2022-08-07 NOTE — Patient Instructions (Addendum)
Good to see y ou today  Do not think the hernia is causing any GI sx problems  Advise try fiber supplement dayli to see of all straightens out  If change in bowel habits continues then we would have you see the GI team .  Still stop tobacco........ Bone health   Will check for celiac and parathyroid issues. In lab work  Advise get shingrix vaccine as discussed.

## 2022-08-08 DIAGNOSIS — Z961 Presence of intraocular lens: Secondary | ICD-10-CM | POA: Diagnosis not present

## 2022-08-08 DIAGNOSIS — H02403 Unspecified ptosis of bilateral eyelids: Secondary | ICD-10-CM | POA: Diagnosis not present

## 2022-08-08 LAB — PTH, INTACT AND CALCIUM
Calcium: 10.5 mg/dL — ABNORMAL HIGH (ref 8.6–10.4)
PTH: 35 pg/mL (ref 16–77)

## 2022-08-15 LAB — CELIAC AB TTG DGP TIGA
Antigliadin Abs, IgA: 3 units (ref 0–19)
Gliadin IgG: 2 units (ref 0–19)
IgA/Immunoglobulin A, Serum: 136 mg/dL (ref 64–422)
Tissue Transglut Ab: <2 U/mL (ref 0–5)
Transglutaminase IgA: <2 U/mL (ref 0–3)

## 2022-08-15 LAB — CELIAC DISEASE HLA DQ ASSOC.
DQ2 (DQA1 0501/0505,DQB1 02XX): NEGATIVE
DQ8 (DQA1 03XX, DQB1 0302): NEGATIVE

## 2022-08-15 NOTE — Progress Notes (Signed)
Labs normal except borderline elevated  calcium level  :negative celiac.  Will follow . Suggest see the Gi team if any o going conerns about bowel /abdomen changes . Let us know and we can do a referral

## 2022-12-13 DIAGNOSIS — L298 Other pruritus: Secondary | ICD-10-CM | POA: Diagnosis not present

## 2022-12-13 DIAGNOSIS — L570 Actinic keratosis: Secondary | ICD-10-CM | POA: Diagnosis not present

## 2022-12-13 DIAGNOSIS — Z08 Encounter for follow-up examination after completed treatment for malignant neoplasm: Secondary | ICD-10-CM | POA: Diagnosis not present

## 2022-12-13 DIAGNOSIS — Z789 Other specified health status: Secondary | ICD-10-CM | POA: Diagnosis not present

## 2022-12-13 DIAGNOSIS — L821 Other seborrheic keratosis: Secondary | ICD-10-CM | POA: Diagnosis not present

## 2022-12-13 DIAGNOSIS — L718 Other rosacea: Secondary | ICD-10-CM | POA: Diagnosis not present

## 2022-12-13 DIAGNOSIS — L814 Other melanin hyperpigmentation: Secondary | ICD-10-CM | POA: Diagnosis not present

## 2022-12-13 DIAGNOSIS — Z85828 Personal history of other malignant neoplasm of skin: Secondary | ICD-10-CM | POA: Diagnosis not present

## 2022-12-13 DIAGNOSIS — L820 Inflamed seborrheic keratosis: Secondary | ICD-10-CM | POA: Diagnosis not present

## 2022-12-13 DIAGNOSIS — D225 Melanocytic nevi of trunk: Secondary | ICD-10-CM | POA: Diagnosis not present

## 2022-12-13 DIAGNOSIS — L538 Other specified erythematous conditions: Secondary | ICD-10-CM | POA: Diagnosis not present

## 2023-03-04 DIAGNOSIS — Z1231 Encounter for screening mammogram for malignant neoplasm of breast: Secondary | ICD-10-CM | POA: Diagnosis not present

## 2023-03-04 LAB — HM MAMMOGRAPHY

## 2023-03-05 ENCOUNTER — Encounter: Payer: Self-pay | Admitting: Internal Medicine

## 2023-04-30 DIAGNOSIS — L821 Other seborrheic keratosis: Secondary | ICD-10-CM | POA: Diagnosis not present

## 2023-04-30 DIAGNOSIS — D485 Neoplasm of uncertain behavior of skin: Secondary | ICD-10-CM | POA: Diagnosis not present

## 2023-04-30 DIAGNOSIS — C44629 Squamous cell carcinoma of skin of left upper limb, including shoulder: Secondary | ICD-10-CM | POA: Diagnosis not present

## 2023-06-10 DIAGNOSIS — C44629 Squamous cell carcinoma of skin of left upper limb, including shoulder: Secondary | ICD-10-CM | POA: Diagnosis not present

## 2023-07-22 ENCOUNTER — Ambulatory Visit (INDEPENDENT_AMBULATORY_CARE_PROVIDER_SITE_OTHER): Payer: Medicare Other

## 2023-07-22 VITALS — BP 120/60 | HR 70 | Temp 98.2°F | Ht <= 58 in | Wt 112.7 lb

## 2023-07-22 DIAGNOSIS — Z0000 Encounter for general adult medical examination without abnormal findings: Secondary | ICD-10-CM | POA: Diagnosis not present

## 2023-07-22 NOTE — Patient Instructions (Addendum)
Ms. Zehring , Thank you for taking time to come for your Medicare Wellness Visit. I appreciate your ongoing commitment to your health goals. Please review the following plan we discussed and let me know if I can assist you in the future.   Referrals/Orders/Follow-Ups/Clinician Recommendations:   This is a list of the screening recommended for you and due dates:  Health Maintenance  Topic Date Due   Zoster (Shingles) Vaccine (1 of 2) 07/28/1956   DTaP/Tdap/Td vaccine (2 - Tdap) 09/26/2014   Flu Shot  01/03/2023   COVID-19 Vaccine (5 - 2024-25 season) 02/03/2023   Mammogram  03/03/2024   Medicare Annual Wellness Visit  07/21/2024   Pneumonia Vaccine  Completed   DEXA scan (bone density measurement)  Completed   HPV Vaccine  Aged Out    Advanced directives: (Copy Requested) Please bring a copy of your health care power of attorney and living will to the office to be added to your chart at your convenience.  Next Medicare Annual Wellness Visit scheduled for next year: Yes

## 2023-07-22 NOTE — Progress Notes (Signed)
Subjective:   Megan Lucas is a 88 y.o. female who presents for Medicare Annual (Subsequent) preventive examination.  Visit Complete: In person    Cardiac Risk Factors include: advanced age (>69men, >25 women);smoking/ tobacco exposure     Objective:    Today's Vitals   07/22/23 0907  BP: 120/60  Pulse: 70  Temp: 98.2 F (36.8 C)  TempSrc: Oral  SpO2: 95%  Weight: 112 lb 11.2 oz (51.1 kg)  Height: 4\' 10"  (1.473 m)   Body mass index is 23.55 kg/m.     07/22/2023    9:31 AM 07/11/2022    9:20 AM 07/10/2021   10:10 AM 08/13/2016    9:30 AM 11/24/2013    3:34 PM  Advanced Directives  Does Patient Have a Medical Advance Directive? Yes Yes Yes Yes Patient has advance directive, copy not in chart  Type of Advance Directive Healthcare Power of [place];Living will Healthcare Power of [place];Living will Healthcare Power of [place];Living will Healthcare Power of [place];Living will Living will;Healthcare Power of Attorney  Does patient want to make changes to medical advance directive?   No - Patient declined    Copy of Healthcare Power of Attorney in Chart? No - copy requested No - copy requested No - copy requested No - copy requested     Current Medications (verified) Outpatient Encounter Medications as of 07/22/2023  Medication Sig   aspirin EC 81 MG tablet Take 81 mg by mouth daily.   Calcium Carbonate-Vit D-Min (CALCIUM 1200 PO) Take by mouth 2 (two) times daily.   Ginger, Zingiber officinalis, (GINGER ROOT PO) Take by mouth.   glucosamine-chondroitin 500-400 MG tablet Take 1 tablet by mouth 2 (two) times daily.   LUTEIN PO Take 1 tablet by mouth daily.   Multiple Vitamin (MULTIVITAMIN) tablet Take 1 tablet by mouth daily.   Omega-3 Fatty Acids (FISH OIL PO) Take by mouth daily.   POTASSIUM CITRATE PO Take by mouth daily.   TURMERIC PO Take by mouth.   No facility-administered encounter medications on file as of 07/22/2023.    Allergies (verified) Patient has no  known allergies.   History: Past Medical History:  Diagnosis Date   Cataract    Chronic kidney disease    kidney stone left 2018   Colon polyp, hyperplastic    Cough, persistent 09/05/2012   Suspect COPD symptoms rule out infection other.    Diverticulosis    Environmental allergies    History of arm fracture    Left   Osteoarthritis    Osteoporosis    DEXA 2007-3.2 spine -1.6 hip   Past Surgical History:  Procedure Laterality Date   EXTRACORPOREAL SHOCK WAVE LITHOTRIPSY Left 08/13/2016   Procedure: LEFT EXTRACORPOREAL SHOCK WAVE LITHOTRIPSY (ESWL);  Surgeon: Bjorn Pippin, MD;  Location: [facility];  Service: Urology;  Laterality: Left;   NO PAST SURGERIES     Family History  Problem Relation Age of Onset   Breast cancer Unknown    Lung disease Unknown    Stroke Father 49   Prostate cancer Father    Other Mother        Died MVA sister died MVA age 88   Colon cancer Neg Hx    Social History   Socioeconomic History   Marital status: Divorced    Spouse name: Not on file   Number of children: Not on file   Years of education: Not on file   Highest education level: Not on file  Occupational History  Not on file  Tobacco Use   Smoking status: Every Day    Current packs/day: 0.50    Types: Cigarettes   Smokeless tobacco: Never   Tobacco comments:    smokes only in evening - states she does not inhale (04/02/17)  Substance and Sexual Activity   Alcohol use: No   Drug use: No   Sexual activity: Not on file  Other Topics Concern   Not on file  Social History Narrative   Childbirth x2 g2 p2    Negative alcohol tobacco times many years 8 to 10 in the evening   Does do regular exercise seat belt use.   Divorced widowed Careers information officer   No pets   Social Drivers of Health   Financial Resource Strain: Low Risk  (07/22/2023)   Overall Financial Resource Strain (CARDIA)    Difficulty of Paying Living Expenses: Not hard at all  Food Insecurity: No Food  Insecurity (07/22/2023)   Hunger Vital Sign    Worried About Running Out of Food in the Last Year: Never true    Ran Out of Food in the Last Year: Never true  Transportation Needs: No Transportation Needs (07/22/2023)   PRAPARE - Administrator, Civil Service (Medical): No    Lack of Transportation (Non-Medical): No  Physical Activity: Sufficiently Active (07/22/2023)   Exercise Vital Sign    Days of Exercise per Week: 2 days    Minutes of Exercise per Session: 120 min  Stress: No Stress Concern Present (07/22/2023)   [employer] of Occupational Health - Occupational Stress Questionnaire    Feeling of Stress : Not at all  Social Connections: Moderately Integrated (07/22/2023)   Social Connection and Isolation Panel [NHANES]    Frequency of Communication with Friends and Family: More than three times a week    Frequency of Social Gatherings with Friends and Family: More than three times a week    Attends Religious Services: More than 4 times per year    Active Member of [employer] or Organizations: Yes    Attends Engineer, structural: More than 4 times per year    Marital Status: Divorced    Tobacco Counseling Ready to quit: No Counseling given: Yes Tobacco comments: smokes only in evening - states she does not inhale (04/02/17)   Clinical Intake:  Pre-visit preparation completed: Yes  Pain : No/denies pain     BMI - recorded: 23.55 Nutritional Status: BMI of 19-24  Normal Nutritional Risks: None Diabetes: No  How often do you need to have someone help you when you read instructions, pamphlets, or other written materials from your doctor or pharmacy?: 1 - Never  Interpreter Needed?: No  Information entered by :: Theresa Mulligan LPN   Activities of Daily Living    07/22/2023    9:27 AM  In your present state of health, do you have any difficulty performing the following activities:  Hearing? 1  Comment Wears Hearing Aids  Vision? 0  Difficulty  concentrating or making decisions? 0  Walking or climbing stairs? 0  Dressing or bathing? 0  Doing errands, shopping? 0  Preparing Food and eating ? N  Using the Toilet? N  In the past six months, have you accidently leaked urine? Y  Comment Wears Pads. Followed by PCP  Do you have problems with loss of bowel control? N  Managing your Medications? N  Managing your Finances? N  Housekeeping or managing your Housekeeping? N  Patient Care Team: Madelin Headings, MD as PCP - General  Indicate any recent Medical Services you may have received from other than [facility] providers in the past year (date may be approximate).     Assessment:   This is a routine wellness examination for Tommy.  Hearing/Vision screen Hearing Screening - Comments:: Wears Hearing Aids Vision Screening - Comments:: Wears rx glasses - up to date with routine eye exams with  [facility]   Goals Addressed               This Visit's Progress     Continue to stay active (pt-stated)        Continue to maintain exercise and good health.       Depression Screen    07/22/2023    9:12 AM 08/07/2022   10:30 AM 07/11/2022    9:17 AM 07/10/2021   10:04 AM 11/30/2020    3:10 PM 04/02/2017   11:03 AM 10/21/2013   11:01 AM  PHQ 2/9 Scores  PHQ - 2 Score 0 1 0 0 1 0 0  PHQ- 9 Score  1   1      Fall Risk    07/22/2023    9:29 AM 08/07/2022   10:29 AM 07/11/2022    9:20 AM 07/10/2021   10:08 AM 11/30/2020    2:40 PM  Fall Risk   Falls in the past year? 0 0 0 0 1  Number falls in past yr: 0 0 0 0 1  Injury with Fall? 0 0 0 0 0  Risk for fall due to : No Fall Risks No Fall Risks No Fall Risks    Follow up Falls prevention discussed;Falls evaluation completed Falls evaluation completed Falls prevention discussed      MEDICARE RISK AT HOME: Medicare Risk at Home Any stairs in or around the home?: No If so, are there any without handrails?: No Home free of loose throw rugs in walkways, pet beds, electrical cords,  etc?: Yes Adequate lighting in your home to reduce risk of falls?: Yes Life alert?: Yes Use of a cane, walker or w/c?: No Grab bars in the bathroom?: No Shower chair or bench in shower?: No Elevated toilet seat or a handicapped toilet?: No  TIMED UP AND GO:  Was the test performed?  Yes  Length of time to ambulate 10 feet: 10 sec Gait steady and fast without use of assistive device    Cognitive Function:        07/22/2023    9:32 AM 07/11/2022    9:21 AM 07/10/2021   10:11 AM  6CIT Screen  What Year? 0 points 0 points 0 points  What month? 0 points 0 points 0 points  What time? 0 points 0 points 0 points  Count back from 20 0 points 0 points 0 points  Months in reverse 0 points 0 points 0 points  Repeat phrase 0 points 0 points 0 points  Total Score 0 points 0 points 0 points    Immunizations Immunization History  Administered Date(s) Administered   Fluad Quad(high Dose 65+) 04/06/2021   Influenza Whole 03/07/2009   PFIZER(Purple Top)SARS-COV-2 Vaccination 07/09/2019, 08/03/2019, 04/02/2020   Pfizer(Comirnaty)Fall Seasonal Vaccine 12 years and older 07/03/2022   Pneumococcal Conjugate-13 10/21/2013   Pneumococcal Polysaccharide-23 03/07/2009   Td 09/25/2004   Zoster, Unspecified 03/05/2023    TDAP status: Due, Education has been provided regarding the importance of this vaccine. Advised may receive this vaccine at local pharmacy  or Health Dept. Aware to provide a copy of the vaccination record if obtained from local pharmacy or Health Dept. Verbalized acceptance and understanding.  Flu Vaccine status: Due, Education has been provided regarding the importance of this vaccine. Advised may receive this vaccine at local pharmacy or Health Dept. Aware to provide a copy of the vaccination record if obtained from local pharmacy or Health Dept. Verbalized acceptance and understanding.  Pneumococcal vaccine status: Up to date  Covid-19 vaccine status: Declined, Education has been  provided regarding the importance of this vaccine but patient still declined. Advised may receive this vaccine at local pharmacy or Health Dept.or vaccine clinic. Aware to provide a copy of the vaccination record if obtained from local pharmacy or Health Dept. Verbalized acceptance and understanding.  Qualifies for Shingles Vaccine? Yes   Zostavax completed Yes   Screening Tests Health Maintenance  Topic Date Due   Zoster Vaccines- Shingrix (1 of 2) 07/28/1956   DTaP/Tdap/Td (2 - Tdap) 09/26/2014   INFLUENZA VACCINE  01/03/2023   COVID-19 Vaccine (5 - 2024-25 season) 02/03/2023   MAMMOGRAM  03/03/2024   Medicare Annual Wellness (AWV)  07/21/2024   Pneumonia Vaccine 41+ Years old  Completed   DEXA SCAN  Completed   HPV VACCINES  Aged Out    Health Maintenance  Health Maintenance Due  Topic Date Due   Zoster Vaccines- Shingrix (1 of 2) 07/28/1956   DTaP/Tdap/Td (2 - Tdap) 09/26/2014   INFLUENZA VACCINE  01/03/2023   COVID-19 Vaccine (5 - 2024-25 season) 02/03/2023      Mammogram status: Completed 03/04/23. Repeat every year  Bone Density status: Completed 08/07/22. Results reflect: Bone density results: OSTEOPOROSIS. Repeat every   years.    Additional Screening:   Vision Screening: Recommended annual ophthalmology exams for early detection of glaucoma and other disorders of the eye. Is the patient up to date with their annual eye exam?  Yes  Who is the provider or what is the name of the office in which the patient attends annual eye exams? [facility] If pt is not established with a provider, would they like to be referred to a provider to establish care? No .   Dental Screening: Recommended annual dental exams for proper oral hygiene    Community Resource Referral / Chronic Care Management:  CRR required this visit?  No   CCM required this visit?  No     Plan:     I have personally reviewed and noted the following in the patient's chart:   Medical and  social history Use of alcohol, tobacco or illicit drugs  Current medications and supplements including opioid prescriptions. Patient is not currently taking opioid prescriptions. Functional ability and status Nutritional status Physical activity Advanced directives List of other physicians Hospitalizations, surgeries, and ER visits in previous 12 months Vitals Screenings to include cognitive, depression, and falls Referrals and appointments  In addition, I have reviewed and discussed with patient certain preventive protocols, quality metrics, and best practice recommendations. A written personalized care plan for preventive services as well as general preventive health recommendations were provided to patient.     Tillie Rung, LPN   1/61/0960   After Visit Summary: (In Person-Printed) AVS printed and given to the patient  Nurse Notes: None

## 2023-08-21 NOTE — Progress Notes (Unsigned)
 No chief complaint on file.   HPI: Patient  Megan Lucas  88 y.o. comes in today for Preventive Health Care visit  yearly follow up  just had wellness visit   Health Maintenance  Topic Date Due   Zoster Vaccines- Shingrix (1 of 2) 07/28/1954   DTaP/Tdap/Td (2 - Tdap) 09/26/2014   INFLUENZA VACCINE  01/03/2023   COVID-19 Vaccine (5 - 2024-25 season) 02/03/2023   MAMMOGRAM  03/03/2024   Medicare Annual Wellness (AWV)  07/21/2024   Pneumonia Vaccine 61+ Years old  Completed   DEXA SCAN  Completed   HPV VACCINES  Aged Out   Health Maintenance Review LIFESTYLE:  Exercise:   Tobacco/ETS: Alcohol:  Sugar beverages: Sleep: Drug use: no HH of  Work:    ROS:  GEN/ HEENT: No fever, significant weight changes sweats headaches vision problems hearing changes, CV/ PULM; No chest pain shortness of breath cough, syncope,edema  change in exercise tolerance. GI /GU: No adominal pain, vomiting, change in bowel habits. No blood in the stool. No significant GU symptoms. SKIN/HEME: ,no acute skin rashes suspicious lesions or bleeding. No lymphadenopathy, nodules, masses.  NEURO/ PSYCH:  No neurologic signs such as weakness numbness. No depression anxiety. IMM/ Allergy: No unusual infections.  Allergy .   REST of 12 system review negative except as per HPI   Past Medical History:  Diagnosis Date   Cataract    Chronic kidney disease    kidney stone left 2018   Colon polyp, hyperplastic    Cough, persistent 09/05/2012   Suspect COPD symptoms rule out infection other.    Diverticulosis    Environmental allergies    History of arm fracture    Left   Osteoarthritis    Osteoporosis    DEXA 2007-3.2 spine -1.6 hip    Past Surgical History:  Procedure Laterality Date   EXTRACORPOREAL SHOCK WAVE LITHOTRIPSY Left 08/13/2016   Procedure: LEFT EXTRACORPOREAL SHOCK WAVE LITHOTRIPSY (ESWL);  Surgeon: Bjorn Pippin, MD;  Location: [facility];  Service: Urology;  Laterality: Left;   NO PAST  SURGERIES      Family History  Problem Relation Age of Onset   Breast cancer Unknown    Lung disease Unknown    Stroke Father 108   Prostate cancer Father    Other Mother        Died MVA sister died MVA age 18   Colon cancer Neg Hx     Social History   Socioeconomic History   Marital status: Divorced    Spouse name: Not on file   Number of children: Not on file   Years of education: Not on file   Highest education level: Not on file  Occupational History   Not on file  Tobacco Use   Smoking status: Every Day    Current packs/day: 0.50    Types: Cigarettes   Smokeless tobacco: Never   Tobacco comments:    smokes only in evening - states she does not inhale (04/02/17)  Substance and Sexual Activity   Alcohol use: No   Drug use: No   Sexual activity: Not on file  Other Topics Concern   Not on file  Social History Narrative   Childbirth x2 g2 p2    Negative alcohol tobacco times many years 8 to 10 in the evening   Does do regular exercise seat belt use.   Divorced widowed Careers information officer   No pets   Social Drivers of Health  Financial Resource Strain: Low Risk  (07/22/2023)   Overall Financial Resource Strain (CARDIA)    Difficulty of Paying Living Expenses: Not hard at all  Food Insecurity: No Food Insecurity (07/22/2023)   Hunger Vital Sign    Worried About Running Out of Food in the Last Year: Never true    Ran Out of Food in the Last Year: Never true  Transportation Needs: No Transportation Needs (07/22/2023)   PRAPARE - Administrator, Civil Service (Medical): No    Lack of Transportation (Non-Medical): No  Physical Activity: Sufficiently Active (07/22/2023)   Exercise Vital Sign    Days of Exercise per Week: 2 days    Minutes of Exercise per Session: 120 min  Stress: No Stress Concern Present (07/22/2023)   [employer] of Occupational Health - Occupational Stress Questionnaire    Feeling of Stress : Not at all  Social  Connections: Moderately Integrated (07/22/2023)   Social Connection and Isolation Panel [NHANES]    Frequency of Communication with Friends and Family: More than three times a week    Frequency of Social Gatherings with Friends and Family: More than three times a week    Attends Religious Services: More than 4 times per year    Active Member of Clubs or Organizations: Yes    Attends Engineer, structural: More than 4 times per year    Marital Status: Divorced    Outpatient Medications Prior to Visit  Medication Sig Dispense Refill   aspirin EC 81 MG tablet Take 81 mg by mouth daily.     Calcium Carbonate-Vit D-Min (CALCIUM 1200 PO) Take by mouth 2 (two) times daily.     Ginger, Zingiber officinalis, (GINGER ROOT PO) Take by mouth.     glucosamine-chondroitin 500-400 MG tablet Take 1 tablet by mouth 2 (two) times daily.     LUTEIN PO Take 1 tablet by mouth daily.     Multiple Vitamin (MULTIVITAMIN) tablet Take 1 tablet by mouth daily.     Omega-3 Fatty Acids (FISH OIL PO) Take by mouth daily.     POTASSIUM CITRATE PO Take by mouth daily.     TURMERIC PO Take by mouth.     No facility-administered medications prior to visit.     EXAM:  There were no vitals taken for this visit.  There is no height or weight on file to calculate BMI. Wt Readings from Last 3 Encounters:  07/22/23 112 lb 11.2 oz (51.1 kg)  08/07/22 117 lb 9.6 oz (53.3 kg)  07/11/22 115 lb 14.4 oz (52.6 kg)    Physical Exam: Vital signs reviewed [id number] is a well-developed well-nourished alert cooperative    who appearsr stated age in no acute distress.  HEENT: normocephalic atraumatic , Eyes: PERRL EOM's full, conjunctiva clear, Nares: paten,t no deformity discharge or tenderness., Ears: no deformity EAC's clear TMs with normal landmarks. Mouth: clear OP, no lesions, edema.  Moist mucous membranes. Dentition in adequate repair. NECK: supple without masses, thyromegaly or bruits. CHEST/PULM:  Clear to  auscultation and percussion breath sounds equal no wheeze , rales or rhonchi. No chest wall deformities or tenderness. Breast: normal by inspection . No dimpling, discharge, masses, tenderness or discharge . CV: PMI is nondisplaced, S1 S2 no gallops, murmurs, rubs. Peripheral pulses are full without delay.No JVD .  ABDOMEN: Bowel sounds normal nontender  No guard or rebound, no hepato splenomegal no CVA tenderness.  No hernia. Extremtities:  No clubbing cyanosis or edema, no acute joint  swelling or redness no focal atrophy NEURO:  Oriented x3, cranial nerves 3-12 appear to be intact, no obvious focal weakness,gait within normal limits no abnormal reflexes or asymmetrical SKIN: No acute rashes normal turgor, color, no bruising or petechiae. PSYCH: Oriented, good eye contact, no obvious depression anxiety, cognition and judgment appear normal. LN: no cervical axillary inguinal adenopathy  Lab Results  Component Value Date   WBC 7.2 08/07/2022   HGB 14.5 08/07/2022   HCT 43.6 08/07/2022   PLT 175.0 08/07/2022   GLUCOSE 77 08/07/2022   CHOL 192 08/07/2022   TRIG 141.0 08/07/2022   HDL 59.90 08/07/2022   LDLDIRECT 135.6 06/13/2009   LDLCALC 104 (H) 08/07/2022   ALT 13 08/07/2022   AST 17 08/07/2022   NA 141 08/07/2022   K 4.4 08/07/2022   CL 105 08/07/2022   CREATININE 0.74 08/07/2022   BUN 13 08/07/2022   CO2 30 08/07/2022   TSH 1.01 08/07/2022   INR 1.0 02/24/2016    BP Readings from Last 3 Encounters:  07/22/23 120/60  08/07/22 118/70  07/11/22 118/62    Lab results reviewed with patient   ASSESSMENT AND PLAN:  Discussed the following assessment and plan:  No diagnosis found. No follow-ups on file.  Patient Care Team: Madelin Headings, MD as PCP - General There are no Patient Instructions on file for this visit.  Neta Mends. Taylie Helder M.D.

## 2023-08-22 ENCOUNTER — Ambulatory Visit (INDEPENDENT_AMBULATORY_CARE_PROVIDER_SITE_OTHER): Payer: Medicare Other | Admitting: Internal Medicine

## 2023-08-22 ENCOUNTER — Encounter: Payer: Self-pay | Admitting: Internal Medicine

## 2023-08-22 VITALS — BP 100/66 | HR 80 | Temp 98.3°F | Ht <= 58 in | Wt 112.2 lb

## 2023-08-22 DIAGNOSIS — E559 Vitamin D deficiency, unspecified: Secondary | ICD-10-CM | POA: Diagnosis not present

## 2023-08-22 DIAGNOSIS — Z79899 Other long term (current) drug therapy: Secondary | ICD-10-CM | POA: Diagnosis not present

## 2023-08-22 DIAGNOSIS — K429 Umbilical hernia without obstruction or gangrene: Secondary | ICD-10-CM

## 2023-08-22 DIAGNOSIS — R198 Other specified symptoms and signs involving the digestive system and abdomen: Secondary | ICD-10-CM

## 2023-08-22 DIAGNOSIS — R112 Nausea with vomiting, unspecified: Secondary | ICD-10-CM | POA: Diagnosis not present

## 2023-08-22 LAB — BASIC METABOLIC PANEL
BUN: 14 mg/dL (ref 6–23)
CO2: 28 meq/L (ref 19–32)
Calcium: 10.5 mg/dL (ref 8.4–10.5)
Chloride: 105 meq/L (ref 96–112)
Creatinine, Ser: 0.85 mg/dL (ref 0.40–1.20)
GFR: 62.19 mL/min (ref >60.00)
Glucose, Bld: 99 mg/dL (ref 70–99)
Potassium: 4.1 meq/L (ref 3.5–5.1)
Sodium: 141 meq/L (ref 135–145)

## 2023-08-22 LAB — URINALYSIS, ROUTINE W REFLEX MICROSCOPIC
Bilirubin Urine: NEGATIVE
Hgb urine dipstick: NEGATIVE
Leukocytes,Ua: NEGATIVE
Nitrite: NEGATIVE
Specific Gravity, Urine: 1.02 (ref 1.000–1.030)
Urine Glucose: NEGATIVE
Urobilinogen, UA: 0.2 (ref 0.0–1.0)
pH: 6 (ref 5.0–8.0)

## 2023-08-22 LAB — CBC WITH DIFFERENTIAL/PLATELET
Basophils Absolute: 0.1 10*3/uL (ref 0.0–0.1)
Basophils Relative: 0.8 % (ref 0.0–3.0)
Eosinophils Absolute: 0.2 10*3/uL (ref 0.0–0.7)
Eosinophils Relative: 3.3 % (ref 0.0–5.0)
HCT: 44.4 % (ref 36.0–46.0)
Hemoglobin: 14.8 g/dL (ref 12.0–15.0)
Lymphocytes Relative: 16.5 % (ref 12.0–46.0)
Lymphs Abs: 1.2 10*3/uL (ref 0.7–4.0)
MCHC: 33.3 g/dL (ref 30.0–36.0)
MCV: 94.1 fl (ref 78.0–100.0)
Monocytes Absolute: 0.4 10*3/uL (ref 0.1–1.0)
Monocytes Relative: 5.7 % (ref 3.0–12.0)
Neutro Abs: 5.4 10*3/uL (ref 1.4–7.7)
Neutrophils Relative %: 73.7 % (ref 43.0–77.0)
Platelets: 162 10*3/uL (ref 150.0–400.0)
RBC: 4.72 Mil/uL (ref 3.87–5.11)
RDW: 13.9 % (ref 11.5–15.5)
WBC: 7.4 10*3/uL (ref 4.0–10.5)

## 2023-08-22 LAB — T4, FREE: Free T4: 1.09 ng/dL (ref 0.60–1.60)

## 2023-08-22 LAB — C-REACTIVE PROTEIN: CRP: <1 mg/dL (ref 0.5–20.0)

## 2023-08-22 LAB — HEPATIC FUNCTION PANEL
ALT: 13 U/L (ref 0–35)
AST: 19 U/L (ref 0–37)
Albumin: 4.1 g/dL (ref 3.5–5.2)
Alkaline Phosphatase: 68 U/L (ref 39–117)
Bilirubin, Direct: 0.1 mg/dL (ref 0.0–0.3)
Total Bilirubin: 0.5 mg/dL (ref 0.2–1.2)
Total Protein: 6.8 g/dL (ref 6.0–8.3)

## 2023-08-22 LAB — VITAMIN D 25 HYDROXY (VIT D DEFICIENCY, FRACTURES): VITD: 56.08 ng/mL (ref 30.00–100.00)

## 2023-08-22 LAB — TSH: TSH: 1.1 u[IU]/mL (ref 0.35–5.50)

## 2023-08-22 NOTE — Patient Instructions (Addendum)
 Good to see you today   Not sure if  hernia   contributing but usually would cause pain . Lab today and lets get .   The GI team  Will do referral . Stop all supplements  except calcium vit d for now and mv ok In case contributing to sx.   Stop the ASA  as discussed .

## 2023-08-23 ENCOUNTER — Encounter: Payer: Self-pay | Admitting: Internal Medicine

## 2023-08-23 LAB — IRON,TIBC AND FERRITIN PANEL
%SAT: 30 % (ref 16–45)
Ferritin: 45 ng/mL (ref 16–288)
Iron: 100 ug/dL (ref 45–160)
TIBC: 330 ug/dL (ref 250–450)

## 2023-08-23 NOTE — Progress Notes (Signed)
 Blood work  in range  ( reassuring) but doesn't explain the GI problems . Will proceed with the GI consult   has already been placed and you should be notified  of appt soon.

## 2023-09-02 ENCOUNTER — Ambulatory Visit (INDEPENDENT_AMBULATORY_CARE_PROVIDER_SITE_OTHER)

## 2023-09-02 ENCOUNTER — Ambulatory Visit: Admitting: Podiatry

## 2023-09-02 DIAGNOSIS — M2042 Other hammer toe(s) (acquired), left foot: Secondary | ICD-10-CM | POA: Diagnosis not present

## 2023-09-02 DIAGNOSIS — M21619 Bunion of unspecified foot: Secondary | ICD-10-CM | POA: Diagnosis not present

## 2023-09-02 NOTE — Progress Notes (Signed)
 Subjective:  Patient ID: Megan Lucas, female    DOB: 1935-10-16,  MRN: [id number]  Chief Complaint  Patient presents with   Lewie Chamber Toe    RM#12 Left foot hammer toe pain has started several weeks ago now and is causing discomfort although her foot has been shaped like this for years.    Discussed the use of AI scribe software for clinical note transcription with the patient, who gave verbal consent to proceed.  History of Present Illness The patient presents with a longstanding left foot hammer toe that has been increasingly uncomfortable for the past few months. She describes the pain as intermittent and severe, particularly when wearing certain shoes. Despite the discomfort, she continues to play tennis two to three days a week without exacerbation of symptoms. She has tried various splints and devices to alleviate the discomfort, but these have provided only temporary relief. The patient also reports a nagging discomfort in the arch of her left foot, which is not painful but noticeable. 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 She also has a bunion on the left foot, which she acknowledges is unsightly but does not cause her pain.      Objective:    Physical Exam General: AAO x3, NAD  Dermatological: Skin is warm, dry and supple bilateral.  There are no open sores, no preulcerative lesions, no rash or signs of infection present.  Vascular: Dorsalis Pedis artery and Posterior Tibial artery pedal pulses are 2/4 bilateral with immedate capillary fill time. There is no pain with calf compression, swelling, warmth, erythema.   Neruologic: Grossly intact via light touch bilateral.   Musculoskeletal: Significant bunions present left second toe overlapping the hallux with rigid hammertoe contracture present.  She has tenderness on the PIPJ somewhat rubs in shoes.  There is a  decreased medial arch upon weightbearing.  She subjectively gets tenderness on the medial band plantar fascia in the arch of the foot. No area of pinpoint tenderness. MMT 5/5.   Gait: Unassisted, Nonantalgic.     No images are attached to the encounter.    Results RADIOLOGY Foot X-ray: Bunion with lateral deviation of the first metatarsal bone, overlapping second toe with elevation (09/02/2023)   Assessment:   1. Hammer toe of left foot   2. Bunion   3.      Plantar fasciitis   Plan:  Patient was evaluated and treated and all questions answered.  Assessment and Plan Assessment & Plan Hammer toe with associated bunion Chronic hammer toe on the left foot, exacerbated by a severe bunion causing the second toe to overlap. Discomfort is noted, particularly when wearing shoes, despite conservative measures such as splints.  Surgical options include bunion correction with second toe realignment or toe amputation.  - Explore conservative devices to help hold the second toe down and reduce rubbing.  Dispensed there is offloading devices. - Discuss surgical options if conservative measures fail, including bunion correction with second toe realignment or toe amputation.  Foot arch discomfort Nagging discomfort in the arch of the left foot, not associated with acute pain. She is active, playing tennis multiple times a week, which may contribute to the discomfort. Conservative management includes exercises and topical treatments. - Provide a worksheet with exercises for stretching before and after tennis. - Recommend using a frozen water bottle to roll under the arch for relief. -  Suggest using Voltaren gel as a topical anti-inflammatory for pain relief.  Vivi Barrack DPM     No follow-ups on file.

## 2023-09-16 ENCOUNTER — Encounter: Payer: Self-pay | Admitting: Nurse Practitioner

## 2023-11-04 ENCOUNTER — Ambulatory Visit: Admitting: Podiatry

## 2023-11-04 DIAGNOSIS — M21619 Bunion of unspecified foot: Secondary | ICD-10-CM

## 2023-11-04 DIAGNOSIS — L84 Corns and callosities: Secondary | ICD-10-CM | POA: Diagnosis not present

## 2023-11-04 DIAGNOSIS — B351 Tinea unguium: Secondary | ICD-10-CM | POA: Diagnosis not present

## 2023-11-04 DIAGNOSIS — M79674 Pain in right toe(s): Secondary | ICD-10-CM | POA: Diagnosis not present

## 2023-11-04 DIAGNOSIS — M79675 Pain in left toe(s): Secondary | ICD-10-CM | POA: Diagnosis not present

## 2023-11-04 DIAGNOSIS — M2042 Other hammer toe(s) (acquired), left foot: Secondary | ICD-10-CM | POA: Diagnosis not present

## 2023-11-04 NOTE — Patient Instructions (Signed)
 Look at getting an EXTRA DEPTH or DOUBLE DEPTH shoe  For inserts I like Powersteps and Superfeet

## 2023-11-04 NOTE — Progress Notes (Signed)
  Subjective:  Patient ID: Megan Lucas, female    DOB: [birth year],  MRN: [id number]  Chief Complaint  Patient presents with   RFC    RM#11 RFC     History of Present Illness The patient presents with a longstanding left foot hammer toe that has been increasingly uncomfortable for the past few months.  She been using the offloading pads which helps some.  Still rubs and she likes to play tennis.  She has a corn or callus on the second toe which causes pain.  She also needs her nails trimmed as they get thick and elongated she has difficulty trimming them.  No open lesions.  No injuries.  No other concerns.    Objective:    Physical Exam General: AAO x3, NAD  Dermatological: Hyperkeratotic lesion on the dorsal aspect of left second toe without any underlying ulceration, drainage or signs of infection.  The nails are hypertrophic, dystrophic with yellow, brown discoloration.  No edema, erythema or signs of infection.  There are no open lesions otherwise.  Vascular: Dorsalis Pedis artery and Posterior Tibial artery pedal pulses are 2/4 bilateral with immedate capillary fill time. There is no pain with calf compression, swelling, warmth, erythema.   Neruologic: Grossly intact via light touch bilateral.   Musculoskeletal: Significant bunions present left second toe overlapping the hallux with rigid hammertoe contracture present.  Tenderness palpation mostly on the left second toe dorsally on the PIPJ.  There is no other areas of pinpoint tenderness.  Gait: Unassisted, Nonantalgic.     Assessment:   1. Hammer toe of left foot   2. Bunion   4.      Hyperkeratotic lesion 5.     Symptomatic onychomycosis Plan:  Patient was evaluated and treated and all questions answered.  Assessment and Plan Assessment & Plan Hammer toe with associated bunion - We can discussed with conservative or surgical treatment options.  Unfortunately to reconstruct the foot to help with the second digit  hammertoe down she had to have bunion correction as well as hammertoe repair.  We also discussed amputation of the second toe.  She wants to continue with conservative management.  We discussed shoes, offloading including extra-depth or double depth shoes.  I sharply treated the callus with minimal bleeding.  Antibiotic ointment was applied followed by dressing.  Discussed daily dressing changes, offloading.  Discussed moisturizer at nighttime.  We discussed inserts as well to help support the arch of the foot but discussed possibly this could be too bulky causing increased pressure on the top of the foot.  Symptomatic onychomycosis -Sharply debrided nails x 10 without any complications or bleeding.  Return in about 3 months (around 02/04/2024) for nail/corn trim; hammertoe left 2nd pain.  Charity Conch DPM

## 2023-11-13 ENCOUNTER — Encounter: Payer: Self-pay | Admitting: Nurse Practitioner

## 2023-11-13 ENCOUNTER — Other Ambulatory Visit (INDEPENDENT_AMBULATORY_CARE_PROVIDER_SITE_OTHER)

## 2023-11-13 ENCOUNTER — Ambulatory Visit: Payer: Self-pay | Admitting: Nurse Practitioner

## 2023-11-13 ENCOUNTER — Ambulatory Visit: Admitting: Nurse Practitioner

## 2023-11-13 VITALS — BP 100/60 | HR 76 | Ht <= 58 in | Wt 110.0 lb

## 2023-11-13 DIAGNOSIS — R159 Full incontinence of feces: Secondary | ICD-10-CM

## 2023-11-13 DIAGNOSIS — R152 Fecal urgency: Secondary | ICD-10-CM | POA: Diagnosis not present

## 2023-11-13 DIAGNOSIS — Z860102 Personal history of hyperplastic colon polyps: Secondary | ICD-10-CM

## 2023-11-13 DIAGNOSIS — K429 Umbilical hernia without obstruction or gangrene: Secondary | ICD-10-CM

## 2023-11-13 DIAGNOSIS — R630 Anorexia: Secondary | ICD-10-CM

## 2023-11-13 DIAGNOSIS — K625 Hemorrhage of anus and rectum: Secondary | ICD-10-CM | POA: Diagnosis not present

## 2023-11-13 DIAGNOSIS — R634 Abnormal weight loss: Secondary | ICD-10-CM

## 2023-11-13 DIAGNOSIS — K649 Unspecified hemorrhoids: Secondary | ICD-10-CM

## 2023-11-13 LAB — BASIC METABOLIC PANEL WITH GFR
BUN: 17 mg/dL (ref 6–23)
CO2: 30 meq/L (ref 19–32)
Calcium: 10.1 mg/dL (ref 8.4–10.5)
Chloride: 105 meq/L (ref 96–112)
Creatinine, Ser: 0.81 mg/dL (ref 0.40–1.20)
GFR: 65.79 mL/min (ref >60.00)
Glucose, Bld: 89 mg/dL (ref 70–99)
Potassium: 4.1 meq/L (ref 3.5–5.1)
Sodium: 141 meq/L (ref 135–145)

## 2023-11-13 LAB — CBC
HCT: 43.4 % (ref 36.0–46.0)
Hemoglobin: 14.4 g/dL (ref 12.0–15.0)
MCHC: 33.3 g/dL (ref 30.0–36.0)
MCV: 92.4 fl (ref 78.0–100.0)
Platelets: 171 10*3/uL (ref 150.0–400.0)
RBC: 4.69 Mil/uL (ref 3.87–5.11)
RDW: 13.7 % (ref 11.5–15.5)
WBC: 6.7 10*3/uL (ref 4.0–10.5)

## 2023-11-13 MED ORDER — HYDROCORTISONE ACETATE 25 MG RE SUPP: 25.0000 mg | Freq: Every evening | RECTAL | 1 refills | Status: AC

## 2023-11-13 NOTE — Progress Notes (Signed)
 Noted and agree.

## 2023-11-13 NOTE — Progress Notes (Signed)
 11/13/2023 Megan Lucas [id number] 09/18/1935   CHIEF COMPLAINT: Umbilical hernia, N/V  HISTORY OF PRESENT ILLNESS: Megan Lucas is an 88 year old female known with a past medical history of osteoarthritis, osteoporosis, chronic tobacco use, CKD, kidney stones, hepatic steatosis, diverticulosis and hyperplastic colon polyps. She presents her office today as referred by Dr. Daphine Eagle for further evaluation regarding nausea, vomiting and an umbilical hernia.  Patient stated she had 1 episode of nausea vomiting in the fall 2024 without further recurrence since then.  She has a known history of an umbilical hernia and she denies any associated central abdominal pain.  No upper or lower abdominal pain.  She typically passes a normal formed brown bowel movement daily.  She takes Metamucil 4 capsules daily.  She has increased urgency and passes a solid or loose stool which occurs 2-3 times monthly and occasionally has fecal leakage.  She sees a small amount of bright red blood on the toilet tissue which might occur once or twice weekly for years which she attributes to having hemorrhoids.  She has a decreased appetite and questions if that is due to aging.  She believes she has lost a few pounds within the past 6 to 12 months.  Overall, she stated feeling quite well at the age of 76 but sometimes worries about having cancer.  She is no longer taking ASA.  She underwent a CTAP 12/09/2020 which identified diverticulosis to the left colon and a midline supraumbilical ventral hernia containing mesenteric fat and a small amount of fluid without bowel herniation.  Her most recent colonoscopy was 12/08/2013 which identified severe diverticulosis noted throughout the entire examined colon, no polyps.  Prior colonoscopy in 2010 identified one 20 mm hyperplastic polyp removed from the colon.  No known family history of colorectal cancer.     Latest Ref Rng & Units 08/22/2023    9:12 AM 08/07/2022   11:33 AM  11/30/2020    3:11 PM  CBC  WBC 4.0 - 10.5 K/uL 7.4  7.2  7.3   Hemoglobin 12.0 - 15.0 g/dL 70.6  23.7  62.8   Hematocrit 36.0 - 46.0 % 44.4  43.6  40.7   Platelets 150.0 - 400.0 K/uL 162.0  175.0  182.0        Latest Ref Rng & Units 08/22/2023    9:12 AM 08/07/2022   11:33 AM 11/30/2020    3:11 PM  CMP  Glucose 70 - 99 mg/dL 99  77  88   BUN 6 - 23 mg/dL 14  13  15    Creatinine 0.40 - 1.20 mg/dL 3.15  1.76  1.60   Sodium 135 - 145 mEq/L 141  141  142   Potassium 3.5 - 5.1 mEq/L 4.1  4.4  4.0   Chloride 96 - 112 mEq/L 105  105  105   CO2 19 - 32 mEq/L 28  30  28    Calcium 8.4 - 10.5 mg/dL 73.7  10.6    26.9  48.5   Total Protein 6.0 - 8.3 g/dL 6.8  6.6  6.4   Total Bilirubin 0.2 - 1.2 mg/dL 0.5  0.5  0.4   Alkaline Phos 39 - 117 U/L 68  65  67   AST 0 - 37 U/L 19  17  19    ALT 0 - 35 U/L 13  13  15      CTAP with contrast 12/09/2020: FINDINGS: Lower chest: No acute pleural or parenchymal lung disease. Persistent  bibasilar subpleural scarring.   Hepatobiliary: Mild diffuse hepatic steatosis. No focal liver abnormality. The gallbladder is unremarkable.   Pancreas: Unremarkable. No pancreatic ductal dilatation or surrounding inflammatory changes.   Spleen: Normal in size without focal abnormality.   Adrenals/Urinary Tract: Adrenal glands are unremarkable. Kidneys are normal, without renal calculi, focal lesion, or hydronephrosis. Bladder is unremarkable.   Stomach/Bowel: No bowel obstruction or ileus. Normal appendix right lower quadrant. Diverticulosis of the descending and sigmoid colon without diverticulitis. No bowel wall thickening or inflammatory change.   Vascular/Lymphatic: Mild diffuse atherosclerosis of the aorta. No pathologic adenopathy within the abdomen or pelvis.   Reproductive: Uterus and bilateral adnexa are unremarkable.   Other: No free fluid or free gas. There is a midline supraumbilical ventral hernia containing mesenteric fat and a small amount  of fluid, larger than prior study. Abdominal wall defect measures approximately 2.3 x 1.7 cm. No bowel herniation.   There is a smaller fat containing right periumbilical ventral hernia as well, unchanged.   Musculoskeletal: No acute or destructive bony lesions. Left convex lumbar scoliosis with multilevel spondylosis, greatest at the lumbosacral junction. Reconstructed images demonstrate no additional findings.   IMPRESSION: 1. Fat containing midline supraumbilical and right periumbilical ventral hernias. The supraumbilical hernia has increased in size since prior study, and contains mesenteric fat and a small amount of fluid. No bowel herniation. 2. Diverticulosis without diverticulitis. 3. Mild hepatic steatosis.  PAST GI PROCEDURES:  Colonoscopy 12/08/2013 by Dr. Elvin Hammer: 1. Severe diverticulosis was noted throughout the entire examined colon  2. The colon was otherwise normal   Colonoscopy 05/10/2009:  One 20 mm sessile hyperplastic polyp removed from the ascending colon Moderate diverticulosis throughout the colon Internal hemorrhoids   Past Medical History:  Diagnosis Date   Cataract    Chronic kidney disease    kidney stone left 2018   Colon polyp, hyperplastic    Cough, persistent 09/05/2012   Suspect COPD symptoms rule out infection other.    Diverticulosis    Environmental allergies    History of arm fracture    Left   Osteoarthritis    Osteoporosis    DEXA 2007-3.2 spine -1.6 hip   Past Surgical History:  Procedure Laterality Date   EXTRACORPOREAL SHOCK WAVE LITHOTRIPSY Left 08/13/2016   Procedure: LEFT EXTRACORPOREAL SHOCK WAVE LITHOTRIPSY (ESWL);  Surgeon: Homero Luster, MD;  Location: [facility];  Service: Urology;  Laterality: Left;   NO PAST SURGERIES     Social History: She is divorced.  She has 2 daughters.  Retired.  She reports that she has been smoking cigarettes. She has never used smokeless tobacco. She reports that she does not drink alcohol and does  not use drugs.  Family History: family history includes Breast cancer in her unknown relative; Lung disease in her unknown relative; Other in her mother; Prostate cancer in her father; Stroke (age of onset: 25) in her father.  No known family history of colorectal cancer.  No Known Allergies    Outpatient Encounter Medications as of 11/13/2023  Medication Sig   aspirin EC 81 MG tablet Take 81 mg by mouth daily. (Patient not taking: Reported on 11/04/2023)   Calcium Carbonate-Vit D-Min (CALCIUM 1200 PO) Take by mouth 2 (two) times daily.   Ginger, Zingiber officinalis, (GINGER ROOT PO) Take by mouth. (Patient not taking: Reported on 11/04/2023)   glucosamine-chondroitin 500-400 MG tablet Take 1 tablet by mouth 2 (two) times daily. (Patient not taking: Reported on 11/04/2023)   LUTEIN PO Take 1  tablet by mouth daily.   Multiple Vitamin (MULTIVITAMIN) tablet Take 1 tablet by mouth daily. (Patient not taking: Reported on 11/04/2023)   Omega-3 Fatty Acids (FISH OIL PO) Take by mouth daily. (Patient not taking: Reported on 11/04/2023)   POTASSIUM CITRATE PO Take by mouth daily.   TURMERIC PO Take by mouth. (Patient not taking: Reported on 11/04/2023)   No facility-administered encounter medications on file as of 11/13/2023.   REVIEW OF SYSTEMS:  Gen: + Weight loss. Denies fever, sweats or chills. CV: Denies chest pain, palpitations or edema. Resp: Denies cough, shortness of breath of hemoptysis.  GI: See HPI. GU: Denies urinary burning, blood in urine, increased urinary frequency or incontinence. MS: Denies joint pain, muscles aches or weakness. Derm: Denies rash, itchiness, skin lesions or unhealing ulcers. Psych: Denies depression, anxiety, memory loss or confusion. Heme: Denies bruising, easy bleeding. Neuro:  Denies headaches, dizziness or paresthesias. Endo:  Denies any problems with DM, thyroid  or adrenal function.  PHYSICAL EXAM: BP 100/60   Pulse 76   Ht 4' 10 (1.473 m)   Wt 110 lb (49.9  kg)   BMI 22.99 kg/m   Wt Readings from Last 3 Encounters:  11/13/23 110 lb (49.9 kg)  08/22/23 112 lb 3.2 oz (50.9 kg)  07/22/23 112 lb 11.2 oz (51.1 kg)    General: 88 year old female in no acute distress. Head: Normocephalic and atraumatic. Eyes:  Sclerae non-icteric, conjunctive pink. Ears: Normal auditory acuity. Mouth: Dentition intact. No ulcers or lesions.  Neck: Supple, no lymphadenopathy or thyromegaly.  Lungs: Clear bilaterally to auscultation without wheezes, crackles or rhonchi. Heart: Regular rate and rhythm. No murmur, rub or gallop appreciated.  Abdomen: Soft, nontender, nondistended.  Small nontender umbilical hernia.  No hepatosplenomegaly. Normoactive bowel sounds x 4 quadrants.  Rectal: Inflamed prolapsed anal hemorrhoid without active bleeding.  No stool or palpable mass within reach on exam.  Anal sphincter with diminished sphincter tone.  Robin CMA present during exam.    Musculoskeletal: Symmetrical with no gross deformities. Skin: Warm and dry. No rash or lesions on visible extremities. Extremities: No edema. Neurological: Alert oriented x 4, no focal deficits.  Psychological: Alert and cooperative. Normal mood and affect.  ASSESSMENT AND PLAN:  88 year old female with rectal bleeding, inflamed prolapsed anal hemorrhoid on exam without active bleeding. - Continue fiber supplement of choice daily - Apply a small amount of Desitin inside the anal opening and to the external anal area three times daily as needed for anal or hemorrhoidal irritation/bleeding.  - Anusol HC 25 mg suppository 1 PR nightly x 5 nights - CBC  Intermittent fecal leakage - See plan above - Consider pelvic floor physical therapy if fecal leakage persists  Umbilical hernia, asymptomatic - PCP to continue to monitor, consider general surgery consult if she becomes symptomatic  Decreased appetite, weight loss.  Patient worries about having cancer. - CTAP - BMP  History of a large  hyperplastic polyp per colonoscopy 05/2009.  Colonoscopy 12/2013 identified severe diverticulosis throughout the colon without colon polyps. - No further colon polyp surveillance colonoscopies due to age      CC:  Panosh, Wanda K, MD

## 2023-11-13 NOTE — Patient Instructions (Signed)
 Apply a small amount of Desitin inside the anal opening and to the external anal area three times daily as needed for anal or hemorrhoidal irritation/bleeding.   You have been scheduled for a CT scan of the abdomen and pelvis at [facility], 1st floor Radiology. You are scheduled on 11/26/2023 at 2:30 to start to drink oral contrast, scan is at 4:30 pm. You should arrive 15 minutes prior to your appointment time for registration.    Your provider has requested that you go to the basement level for lab work before leaving today. Press B on the elevator. The lab is located at the first door on the left as you exit the elevator.   Take Metamucil 4 caps daily  We have sent the following medications to your pharmacy for you to pick up at your convenience: Anusol Suppositories   Due to recent changes in healthcare laws, you may see the results of your imaging and laboratory studies on MyChart before your provider has had a chance to review them.  We understand that in some cases there may be results that are confusing or concerning to you. Not all laboratory results come back in the same time frame and the provider may be waiting for multiple results in order to interpret others.  Please give us  48 hours in order for your provider to thoroughly review all the results before contacting the office for clarification of your results.    I appreciate the  opportunity to care for you  Thank You   Megan Lucas

## 2023-11-26 ENCOUNTER — Ambulatory Visit (HOSPITAL_COMMUNITY)
Admission: RE | Admit: 2023-11-26 | Discharge: 2023-11-26 | Disposition: A | Discharge status: Home / Self Care | Source: Ambulatory Visit | Attending: Nurse Practitioner | Admitting: Nurse Practitioner

## 2023-11-26 DIAGNOSIS — R152 Fecal urgency: Secondary | ICD-10-CM | POA: Diagnosis present

## 2023-11-26 DIAGNOSIS — K439 Ventral hernia without obstruction or gangrene: Secondary | ICD-10-CM | POA: Diagnosis not present

## 2023-11-26 DIAGNOSIS — K5730 Diverticulosis of large intestine without perforation or abscess without bleeding: Secondary | ICD-10-CM | POA: Diagnosis not present

## 2023-11-26 DIAGNOSIS — R159 Full incontinence of feces: Secondary | ICD-10-CM | POA: Diagnosis present

## 2023-11-26 DIAGNOSIS — K429 Umbilical hernia without obstruction or gangrene: Secondary | ICD-10-CM | POA: Diagnosis not present

## 2023-11-26 DIAGNOSIS — K625 Hemorrhage of anus and rectum: Secondary | ICD-10-CM | POA: Diagnosis not present

## 2023-11-26 DIAGNOSIS — R634 Abnormal weight loss: Secondary | ICD-10-CM | POA: Diagnosis not present

## 2023-11-26 MED ORDER — IOHEXOL 9 MG/ML PO SOLN
500.0000 mL | ORAL | Status: AC
  Administered 2023-11-26 (×2): 500 mL via ORAL

## 2023-11-26 MED ORDER — IOHEXOL 300 MG/ML  SOLN
100.0000 mL | Freq: Once | INTRAMUSCULAR | Status: AC | PRN
  Administered 2023-11-26: 80 mL via INTRAVENOUS

## 2023-11-26 MED ORDER — IOHEXOL 9 MG/ML PO SOLN
ORAL | Status: AC
  Filled 2023-11-26: qty 1000

## 2024-01-27 ENCOUNTER — Ambulatory Visit: Admitting: Podiatry

## 2024-02-11 DIAGNOSIS — T1512XA Foreign body in conjunctival sac, left eye, initial encounter: Secondary | ICD-10-CM | POA: Diagnosis not present

## 2024-02-27 ENCOUNTER — Ambulatory Visit: Admitting: Podiatry

## 2024-03-09 DIAGNOSIS — Z1231 Encounter for screening mammogram for malignant neoplasm of breast: Secondary | ICD-10-CM | POA: Diagnosis not present

## 2024-03-09 LAB — HM MAMMOGRAPHY

## 2024-03-19 DIAGNOSIS — L03115 Cellulitis of right lower limb: Secondary | ICD-10-CM | POA: Diagnosis not present

## 2024-03-23 ENCOUNTER — Ambulatory Visit: Admitting: Podiatry

## 2024-03-23 DIAGNOSIS — M79674 Pain in right toe(s): Secondary | ICD-10-CM

## 2024-03-23 DIAGNOSIS — B351 Tinea unguium: Secondary | ICD-10-CM

## 2024-03-23 DIAGNOSIS — M79675 Pain in left toe(s): Secondary | ICD-10-CM

## 2024-03-23 NOTE — Progress Notes (Unsigned)
  Subjective:  Patient ID: Megan Lucas, female    DOB: 1936/04/04,  MRN: [id number]  Chief Complaint  Patient presents with   Nail Problem    Nail trim     History of Present Illness The patient presents with a longstanding left foot hammer toe however states that the area is not been hurting much.  The callus does not come back on the second toe.   Her main concern today is that she cannot trim her nails as they can become thick and elongated she cannot bend over to cut them they cause discomfort as they become elongated.  No swelling redness or drainage.  No open lesions.  No other concerns.    Objective:    Physical Exam General: AAO x3, NAD  Dermatological: Today there is no significant hyperkeratotic lesion on the dorsal aspect of left second toe without any underlying ulceration, drainage or signs of infection.  The nails are hypertrophic, dystrophic with yellow, brown discoloration.  No edema, erythema or signs of infection.  She does get tenderness nails 1-5 bilaterally.  There are no open lesions otherwise.  Vascular: Dorsalis Pedis artery and Posterior Tibial artery pedal pulses are 2/4 bilateral with immedate capillary fill time. There is no pain with calf compression, swelling, warmth, erythema.   Neruologic: Grossly intact via light touch bilateral.   Musculoskeletal: Significant bunions present left second toe overlapping the hallux with rigid hammertoe contracture present.  No pain on exam.  She states that she is still able to be active and play tennis without significant restrictions given her feet.  There is no other areas of pinpoint tenderness.   Assessment:    Symptomatic onychomycosis  Plan:  Patient was evaluated and treated and all questions answered.  Assessment and Plan Assessment & Plan   Symptomatic onychomycosis -Sharply debrided nails x 10 without any complications or bleeding.  Return in about 2 months (around 05/24/2024) for nail trim  .   Megan Lucas DPM

## 2024-03-26 DIAGNOSIS — L03115 Cellulitis of right lower limb: Secondary | ICD-10-CM | POA: Diagnosis not present

## 2024-06-08 ENCOUNTER — Ambulatory Visit: Admitting: Podiatry

## 2024-06-08 ENCOUNTER — Encounter: Payer: Self-pay | Admitting: Podiatry

## 2024-06-08 DIAGNOSIS — M2042 Other hammer toe(s) (acquired), left foot: Secondary | ICD-10-CM

## 2024-06-08 DIAGNOSIS — B351 Tinea unguium: Secondary | ICD-10-CM | POA: Diagnosis not present

## 2024-06-08 DIAGNOSIS — M79675 Pain in left toe(s): Secondary | ICD-10-CM

## 2024-06-08 DIAGNOSIS — M79674 Pain in right toe(s): Secondary | ICD-10-CM | POA: Diagnosis not present

## 2024-06-08 NOTE — Progress Notes (Signed)
"  °  Subjective:  Patient ID: Megan Lucas, female    DOB: April 26, 1936,  MRN: [id number]  Chief Complaint  Patient presents with   RFC    [id number] Routine foot care   89 year old female presents the office the above concerns.  She said that she is not able to remember the toenails herself and they cause discomfort.  No swelling, redness or any drainage.  She states that hammertoes currently are not causing any pain and she continues with offloading pads to help hold the toes down.  At times over evidence that she has but otherwise no significant pain.   Objective:    Physical Exam General: AAO x3, NAD  Dermatological: Nails are hypertrophic, dystrophic, brittle, discolored, elongated 10. No surrounding redness or drainage. Tenderness nails 1-5 bilaterally. No open lesions or pre-ulcerative lesions are identified today.   Vascular: Dorsalis Pedis artery and Posterior Tibial artery pedal pulses are 2/4 bilateral with immedate capillary fill time. There is no pain with calf compression, swelling, warmth, erythema.   Neruologic: Grossly intact via light touch bilateral.   Musculoskeletal: Significant bunions present left second toe overlapping the hallux with rigid hammertoe contracture present.  No pain on exam today and there is no other area discomfort.   Assessment:    Symptomatic onychomycosis  Plan:  Patient was evaluated and treated and all questions answered.  Assessment and Plan Assessment & Plan   Symptomatic onychomycosis -Sharply debrided nails x 10 without any complications or bleeding.  Hammertoes - We again discussed both conservative as well as surgical options.  Given that the hammertoes and bunions do not cause any pain and she is currently being controlled with offloading pads would not continue this.  Discussed shoe modifications to avoid any excess pressure.  Will continue to monitor.  Return in about 3 months (around 09/06/2024) for nail trim.  Donnice JONELLE Fees DPM  "

## 2024-07-27 ENCOUNTER — Ambulatory Visit: Payer: Medicare Other

## 2024-09-14 ENCOUNTER — Ambulatory Visit: Admitting: Podiatry
